# Patient Record
Sex: Male | Born: 2000
Health system: Southern US, Community
[De-identification: ages and names within clinical notes are randomized; demographics above are authoritative.]

## PROBLEM LIST (undated history)

## (undated) DIAGNOSIS — E049 Nontoxic goiter, unspecified: Secondary | ICD-10-CM

## (undated) DIAGNOSIS — R6252 Short stature (child): Secondary | ICD-10-CM

## (undated) DIAGNOSIS — E063 Autoimmune thyroiditis: Secondary | ICD-10-CM

## (undated) DIAGNOSIS — Q909 Down syndrome, unspecified: Secondary | ICD-10-CM

## (undated) DIAGNOSIS — F909 Attention-deficit hyperactivity disorder, unspecified type: Secondary | ICD-10-CM

## (undated) DIAGNOSIS — K5904 Chronic idiopathic constipation: Secondary | ICD-10-CM

## (undated) DIAGNOSIS — K561 Intussusception: Secondary | ICD-10-CM

## (undated) DIAGNOSIS — E301 Precocious puberty: Secondary | ICD-10-CM

## (undated) DIAGNOSIS — E079 Disorder of thyroid, unspecified: Secondary | ICD-10-CM

## (undated) HISTORY — DX: Nontoxic goiter, unspecified: E04.9

## (undated) HISTORY — DX: Short stature (child): R62.52

## (undated) HISTORY — DX: Precocious puberty: E30.1

## (undated) HISTORY — DX: Chronic idiopathic constipation: K59.04

## (undated) HISTORY — DX: Down syndrome, unspecified: Q90.9

## (undated) HISTORY — DX: Intussusception: K56.1

## (undated) HISTORY — DX: Attention-deficit hyperactivity disorder, unspecified type: F90.9

## (undated) HISTORY — DX: Disorder of thyroid, unspecified: E07.9

## (undated) HISTORY — DX: Autoimmune thyroiditis: E06.3

---
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
## 2018-02-19 ENCOUNTER — Telehealth: Payer: Self-pay | Admitting: Pediatrics

## 2018-02-19 NOTE — Telephone Encounter (Signed)
Can you call in adderol XR 20 mg to [facility] [address] has a med ck on Tuesday March 3rd, Mom is aware you will call this in Monday 2/17

## 2018-02-22 MED ORDER — AMPHETAMINE-DEXTROAMPHET ER 20 MG PO CP24: 20.0000 mg | ORAL_CAPSULE | Freq: Every day | ORAL | 0 refills | Status: DC

## 2018-02-22 NOTE — Telephone Encounter (Signed)
Medications refilled X 1 month

## 2018-03-09 ENCOUNTER — Encounter: Payer: Medicaid Other | Admitting: Pediatrics

## 2018-03-17 ENCOUNTER — Other Ambulatory Visit: Payer: Self-pay

## 2018-03-17 ENCOUNTER — Encounter: Payer: Self-pay | Admitting: Pediatrics

## 2018-03-17 ENCOUNTER — Ambulatory Visit: Payer: Medicaid Other | Admitting: Pediatrics

## 2018-03-17 VITALS — BP 120/68 | Ht 67.25 in | Wt 135.3 lb

## 2018-03-17 DIAGNOSIS — F902 Attention-deficit hyperactivity disorder, combined type: Secondary | ICD-10-CM

## 2018-03-17 MED ORDER — AMPHETAMINE-DEXTROAMPHET ER 20 MG PO CP24: 20.0000 mg | ORAL_CAPSULE | Freq: Every day | ORAL | 0 refills | Status: DC

## 2018-03-17 MED ORDER — CEPHALEXIN 500 MG PO CAPS: 500.0000 mg | ORAL_CAPSULE | Freq: Two times a day (BID) | ORAL | 0 refills | Status: AC

## 2018-03-17 NOTE — Progress Notes (Signed)
ADHD meds refilled after normal weight and Blood pressure. Doing well on present dose. See again in 3 months  

## 2018-03-18 ENCOUNTER — Encounter: Payer: Self-pay | Admitting: Pediatrics

## 2018-03-25 LAB — TSH: TSH: 1.42 mIU/L (ref 0.50–4.30)

## 2018-03-25 LAB — T4, FREE: Free T4: 1.3 ng/dL (ref 0.8–1.4)

## 2018-03-25 LAB — T3, FREE: T3, Free: 3.1 pg/mL (ref 3.0–4.7)

## 2018-04-02 ENCOUNTER — Ambulatory Visit (INDEPENDENT_AMBULATORY_CARE_PROVIDER_SITE_OTHER): Payer: Medicaid Other | Admitting: "Endocrinology

## 2018-04-02 ENCOUNTER — Other Ambulatory Visit: Payer: Self-pay

## 2018-04-02 DIAGNOSIS — E663 Overweight: Secondary | ICD-10-CM

## 2018-04-02 DIAGNOSIS — E063 Autoimmune thyroiditis: Secondary | ICD-10-CM

## 2018-04-02 DIAGNOSIS — Q909 Down syndrome, unspecified: Secondary | ICD-10-CM | POA: Diagnosis not present

## 2018-04-02 DIAGNOSIS — E049 Nontoxic goiter, unspecified: Secondary | ICD-10-CM

## 2018-04-02 NOTE — Patient Instructions (Signed)
Follow up visit in 4 months. Please obtain lab tests 1-2 weeks prior.  

## 2018-04-02 NOTE — Progress Notes (Signed)
  This is a Pediatric Specialist E-Visit follow up consult provided via  Telephone,  Tony Copeland and his mother, Tony Copeland consented to an E-Visit consult today.  Location of patient: [name] is at home (location) Location of provider Molli Knock MD is at the office. Patient was referred by Georgiann Hahn, MD   The following participants were involved in this E-Visit: Ms Shimomura and Dr. Fransico Michael  Chief Complain/ Reason for E-Visit today: Acquired hypothyroidism due to hashimoto's disease Total time on call: 34 minutes Follow up: 4 months

## 2018-04-02 NOTE — Progress Notes (Signed)
Subjective:  Patient Name: Tony Copeland Date of Birth: 01-22-2000  MRN: [id number]  Tony Copeland  presents for this televisit today for follow-up of his acquired primary hypothyroidism, thyroiditis, goiter, and linear growth delay in the setting of Down's syndrome.  HISTORY OF PRESENT ILLNESS:   Tony Antu. is a 18 y.o. Caucasian young man.  [name] was accompanied by his mother.  1. [name] was first referred to me on 10/23/2004 by his PCP, Dr. Caroll Rancher, of [facility], at age 53 for evaluation and management of hypothyroidism in the setting of Down's Syndrome.   A. He had had a several year history of having TSH values in the 4.0-5.0 range. Because the child was an active little boy and because these values were "within normal" according to the lab reference values, no actions were taken. In retrospect, the child tended to be cold frequently. He also had problems with constipation. His past medical history was positive for ADHD and for intussusception repair. Family history was positive for the mother having a goiter and the maternal grandmother taking thyroid medication. Labs on 10/18/04 showed a TSH of 7.328 and a free T4 1.19. He was started on Synthroid, 25 mcg per day at that time.   B. After two months on Synthroid therapy he was brighter, more active, and more interactive. We have continued to treat him with Synthroid ever since, with gradual increases in doses as indicated by serial TFT measurements. Our goal has been to maintain his TSH in the "ideal" range of 1.0-2.0, which is the normal TSH range for 2/3ds of the normal population.  2. The patient's last Pediatric Specialists visit was on 10/05/17. At that visit I continued his Synthroid dose of 100 mcg/day.   A. In the interim, he has been generally healthy. He has not had any further heart palpitations. He continues to have intermittent problems with his scalp flaking. His dermatologist recommended a special selsun shampoo.    B. He is still being treated for ADHD with Adderall XR.  C. He continues to be a big star in [employer]. He was selected to represent [place] at the [place] [employer] in [place] and won gold, silver and Environmental consultant. Based on his performance in [place], he was selected to participate as a member of Team [place] for the [employer] in [place] in March 2019. He was there for three weeks. He played soccer and basketball with S.O. until the corona virus closed things down.    3. Pertinent Review of Systems:  Constitutional: Tony Copeland feels "good". Mother says that he has been doing well overall. Eyes: Vision is good. He wears his glasses for reading. There are no significant eye complaints. He had an eye exam this morning. Neck: The patient has no complaints of anterior neck swelling, soreness, tenderness,  pressure, discomfort, or difficulty swallowing.  Heart: He had palpitations after exercise in October 2017. EKG and ECHO were normal. The palpitations subsequently resolved. Heart rate increases with exercise or other physical activity. The patient has no complaints of chest pain, or chest pressure.  Gastrointestinal: Appetite is good. He is sensitive to dairy products. He does not have any constipation as long as he takes docusate three times per week. He does not have any other complaints of excessive hunger, acid reflux, upset stomach, stomach aches or pains, or diarrhea. Legs: Muscle mass and strength seem normal. There are no complaints of numbness, tingling, burning, or pain. No edema is noted. Feet: There are  no obvious foot problems. There are no complaints of numbness, tingling, burning, or pain. No edema is noted. Neurological: His coordination and strength remain good.   PAST MEDICAL, FAMILY, AND SOCIAL HISTORY:  Past Medical History:  Diagnosis Date  . ADHD (attention deficit hyperactivity disorder)   . Constipation - functional 09/29/2011   Miralax prn  . Delayed linear growth    . Down syndrome   . Down's syndrome   . Goiter   . Hypothyroidism, acquired, autoimmune   . Intussusception of small intestine (HCC) 03/1999   meckel's leading point  . Isosexual precocity   . Thyroiditis, autoimmune     Family History  Problem Relation Age of Onset  . Thyroid disease Mother   . Thyroid disease Maternal Grandmother   . Hypertension Maternal Grandmother   . Cancer Maternal Grandfather        bone  . Heart disease Maternal Grandfather   . Cancer Paternal Grandmother        breast  . Diabetes Neg Hx      Current Outpatient Medications:  .  amphetamine-dextroamphetamine (ADDERALL XR) 20 MG 24 hr capsule, Take 1 capsule (20 mg total) by mouth daily with breakfast., Disp: 30 capsule, Rfl: 0 .  [START ON 04/17/2018] amphetamine-dextroamphetamine (ADDERALL XR) 20 MG 24 hr capsule, Take 1 capsule (20 mg total) by mouth daily with breakfast., Disp: 30 capsule, Rfl: 0 .  [START ON 05/17/2018] amphetamine-dextroamphetamine (ADDERALL XR) 20 MG 24 hr capsule, Take 1 capsule (20 mg total) by mouth daily with breakfast., Disp: 30 capsule, Rfl: 0 .  SYNTHROID 100 MCG tablet, TAKE ONE TABLET BY MOUTH ONCE DAILY, Disp: 90 tablet, Rfl: 1 .  ketoconazole (NIZORAL) 2 % shampoo, Apply 1 application topically 2 (two) times a week. (Patient not taking: Reported on 01/08/2016), Disp: 120 mL, Rfl: 3  Allergies as of 04/02/2018  . (No Known Allergies)   1. School: He is in the 11th grade at [employer]. School is currently closed due to the corona virus. It is unknown at this time whether or nor school will re-open during this academic year.  2. 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   3. Primary Care Provider: Georgiann Hahn, MD  4. He is now covered by Medicaid.   REVIEW OF SYSTEMS: There are no other significant problems involving [name]'s other body systems.   Objective:  LAB DATA:  Results for orders placed or performed in  visit on 10/05/17  T3, free  Result Value Ref Range   T3, Free 3.1 3.0 - 4.7 pg/mL  T4, free  Result Value Ref Range   Free T4 1.3 0.8 - 1.4 ng/dL  TSH  Result Value Ref Range   TSH 1.42 0.50 - 4.30 mIU/L   03/24/18: TSH 1.42, free T4 1.0, free T3 3.1  09/24/17: TSH 1.82, free T4 1.3, free T3 3.2  08/13/16: TSH 2.68, free T4 1.38, free T3 2.7  10/19/15: TSH 3.04, free T4 1.3, free T3 3.04; normal CBC  06/13/15: HbA1c 5.0%; TSH 2.34, free T4 1.3, free T3 3.2  11/24/14: HbA1c 5.0%; TSH 1.899, free T4 1.20, free T3 3.1; C-peptide 3.11 (ref 0.80-3.90)  05/24/14: TSH 2.338, free T4 1.05, free T3 3.3; C-peptide 2.92 (ref 0.80-3.90)  11/14/13: TSH 1.946, free T4 1.21, free T3 3.5   05/18/13: TSH 1.541, free T4 1.27, free T3 3.2  11/09/12: TSH 1.454, free T4 1.39, free T3 3.4  04/30/12: TSH 1.407, free T4  1.28, free T3 3.5  11/03/11: TSH 1.366, free T4 1.41, free T3 3.6   Assessment and Plan:   ASSESSMENT:  1. Hypothyroid, secondary to Hashimoto's disease: His thyroid function tests in March 2020 are within normal limits with his TSH within the ideal treatment goal range of 1.0-2.0. His current Synthroid dose is working for him now.   2. Goiter: His thyroid gland is apparently unchanged in size.  3. Thyroiditis: He has not been complaining of any symptoms. Mom has not seen him favoring his anterior neck. 4. Linear growth delay: Tony Copeland has essentially plateaued in height, c/w his age.   5. Down's syndrome: Tony Copeland is blessed to have a good family that provides him emotional support and ensures that he is as active athletically and socially as he can be.  He misses participating in S.O. activities and being with his friends in school. He is going stir-crazy at home.  6. Overweight: Tony Copeland's weight was higher at his last visit.  He was much more muscular and has much less body fat than the average teenager with Down's syndrome.   PLAN:  1. Diagnostic: We reviewed his lab results from last week.  Repeat TFTs in 4 months.   2. Therapeutic: Continue the Synthroid dose of 100 mcg/day.   3. Patient education: We discussed the likelihood that his Hashimoto's thyroiditis will eventually destroy more and more thyrocytes, resulting in higher and higher doses of Synthroid over time. Also discussed the importance of healthy diet and continuing his excellent level of physical activity.  4. Follow-up: 4 months  Level of Service: This visit lasted 34 minutes.   Molli KnockMichael Shalae Belmonte, MD, CDE Pediatric and Adult Endocrinology

## 2018-04-05 ENCOUNTER — Ambulatory Visit (INDEPENDENT_AMBULATORY_CARE_PROVIDER_SITE_OTHER): Payer: Medicaid Other | Admitting: Pediatrics

## 2018-04-05 ENCOUNTER — Encounter: Payer: Self-pay | Admitting: Pediatrics

## 2018-04-05 ENCOUNTER — Other Ambulatory Visit: Payer: Self-pay

## 2018-04-05 VITALS — Ht 62.5 in | Wt 136.0 lb

## 2018-04-05 DIAGNOSIS — L0100 Impetigo, unspecified: Secondary | ICD-10-CM | POA: Diagnosis not present

## 2018-04-05 MED ORDER — CLINDAMYCIN HCL 300 MG PO CAPS: 300.0000 mg | ORAL_CAPSULE | Freq: Two times a day (BID) | ORAL | 0 refills | Status: AC

## 2018-04-05 NOTE — Patient Instructions (Signed)
Impetigo, Adult  Impetigo is an infection of the skin. It commonly occurs in young children, but it can also occur in adults. The infection causes itchy blisters and sores that produce brownish-yellow fluid. As the fluid dries, it forms a thick, honey-colored crust. These skin changes usually occur on the face, but they can also affect other areas of the body. Impetigo usually goes away in 7-10 days with treatment.  What are the causes?  This condition is caused by two types of bacteria. It may be caused by staphylococci or streptococci bacteria. These bacteria cause impetigo when they get under the surface of the skin. This often happens after some damage to the skin, such as:  Cuts, scrapes, or scratches.  Rashes.  Insect bites, especially when you scratch the area of a bite.  Chickenpox or other illnesses that cause open skin sores.  Nail biting or chewing.  Impetigo can spread easily from one person to another (is contagious). It may be spread through close skin contact or by sharing towels, clothing, or other items that an infected person has touched.  What increases the risk?  The following factors may make you more likely to develop this condition:  Playing sports that include skin-to-skin contact with others.  Having a skin condition with open sores, such as chickenpox.  Having diabetes.  Having a weak body defense system (immune system).  Having many skin cuts or scrapes.  Living in an area that has high humidity levels.  Having poor hygiene.  Having high levels of staphylococci in your nose.  What are the signs or symptoms?  The main symptom of this condition is small blisters, often on the face around the mouth and nose. In time, the blisters break open and turn into tiny sores (lesions) with a yellow crust. In some cases, the blisters cause itching or burning. With scratching, irritation, or lack of treatment, these small lesions may get larger.  Other possible symptoms include:  Larger  blisters.  Pus.  Swollen lymph glands.  Scratching the affected area can cause impetigo to spread to other parts of the body. The bacteria can get under the fingernails and spread when you touch another area of your skin.  How is this diagnosed?  This condition is usually diagnosed during a physical exam. A skin sample or a sample of fluid from a blister may be taken for lab tests that involve growing bacteria (culture test). Lab tests can help to confirm the diagnosis or help to determine the best treatment.  How is this treated?  Treatment for this condition depends on the severity of the condition:  Mild impetigo can be treated with prescription antibiotic cream.  Oral antibiotic medicine may be used in more severe cases.  Medicines that reduce itchiness (antihistamines)may also be used.  Follow these instructions at home:  Medicines  Take over-the-counter and prescription medicines only as told by your health care provider.  Apply or take your antibiotic as told by your health care provider. Do not stop using the antibiotic even if your condition improves.  General instructions    To help prevent impetigo from spreading to other body areas:  Keep your fingernails short and clean.  Do not scratch the blisters or sores.  Cover infected areas, if necessary, to keep from scratching.  Wash your hands often with soap and warm water.  Before applying antibiotic cream or ointment, you should:  Gently wash the infected areas with antibacterial soap and warm water.  Soak crusted   areas in warm, soapy water using antibacterial soap.  Gently rub the areas to remove crusts. Do not scrub.  Do not share towels.  Wash your clothing and bedsheets in warm water that is 140F (60C) or warmer.  Stay home until you have used an antibiotic cream for 48 hours (2 days) or an oral antibiotic medicine for 24 hours (1 day). You should only return to work and activities with other people if your skin shows significant improvement.  You may  return to contact sports after you have used antibiotic medicine for 72 hours (3 days).  Keep all follow-up visits as told by your health care provider. This is important.  How is this prevented?  Wash your hands often with soap and warm water.  Do not share towels, washcloths, clothing, bedding, or razors.  Keep your fingernails short.  Keep any cuts, scrapes, bug bites, or rashes clean and covered.  Use insect repellent to prevent bug bites.  Contact a health care provider if:  You develop more blisters or sores even with treatment.  Other family members get sores.  Your skin sores are not improving after 72 hours (3 days) of treatment.  You have a fever.  Get help right away if:  You see spreading redness or swelling of the skin around your sores.  You see red streaks coming from your sores.  You develop a sore throat.  The area around your rash becomes warm, red, or tender to the touch.  You have dark, reddish-brown urine.  You do not urinate often or you urinate small amounts.  You are very tired (lethargic).  You have swelling in the face, hands, or feet.  Summary  Impetigo is a skin infection that causes itchy blisters and sores that produce brownish-yellow fluid. As the fluid dries, it forms a crust.  This condition is caused by staphylococci or streptococci bacteria. These bacteria cause impetigo when they get under the surface of the skin, such as through cuts, rashes, bug bites, or open sores.  Treatment for this condition may include antibiotic ointment or oral antibiotics.  To help prevent impetigo from spreading to other body areas, make sure you keep your fingernails short, avoid scratching, cover any blisters, and wash your hands often.  If you have impetigo, stay home until you have used an antibiotic cream for 48 hours (2 days) or an oral antibiotic medicine for 24 hours (1 day). You should only return to work and activities with other people if your skin shows significant improvement.  This  information is not intended to replace advice given to you by your health care provider. Make sure you discuss any questions you have with your health care provider.  Document Released: 01/13/2014 Document Revised: 01/15/2016 Document Reviewed: 01/15/2016  Elsevier Interactive Patient Education  2019 Elsevier Inc.

## 2018-04-05 NOTE — Progress Notes (Signed)
Virtual Visit via Telephone Note  I connected with Tony Copeland mom on 04/05/18 at 11:45 AM EDT by telephone and verified that I am speaking with the correct person using two identifiers.   I discussed the limitations, risks, security and privacy concerns of performing an evaluation and management service by telephone and the availability of in person appointments. I also discussed with the patient that there may be a patient responsible charge related to this service. The patient expressed understanding and agreed to proceed.   Presents from picture sent  with red papules to anterior abdominal wall  for the past three days. Low grade fever, no discharge, no swelling and no limitation of motion.   Review of Systems  Constitutional: Negative.  Negative for fever, activity change and appetite change.  HENT: Negative.  Negative for ear pain, congestion and rhinorrhea.   Eyes: Negative.   Respiratory: Negative.  Negative for cough and wheezing.   Cardiovascular: Negative.   Gastrointestinal: Negative.   Musculoskeletal: Negative.  Negative for myalgias, joint swelling and gait problem.  Neurological: Negative for numbness.  Hematological: Negative for adenopathy. Does not bruise/bleed easily.        Objective:   Physical Exam  Constitutional: Appears well-developed and well-nourished. Active. No distress.  Mom described redness and swollen area to armpit bilaterally---may need drainage.       Assessment:     Impetigo secondary to folliculitis    Plan:   Will treat with oral clindamycin and follow up in 2-3 days --evaluate for I and D at that visit.      I discussed the assessment and treatment plan with the patient. The patient was provided an opportunity to ask questions and all were answered. The patient agreed with the plan and demonstrated an understanding of the instructions.   The patient was advised to call back or seek an in-person evaluation if the symptoms worsen or  if the condition fails to improve as anticipated.  I provided 15 minutes of non-face-to-face time during this encounter.   Georgiann Hahn, MD

## 2018-04-08 ENCOUNTER — Ambulatory Visit: Payer: Medicaid Other | Admitting: Pediatrics

## 2018-05-17 ENCOUNTER — Encounter: Payer: Self-pay | Admitting: Pediatrics

## 2018-05-17 ENCOUNTER — Ambulatory Visit (INDEPENDENT_AMBULATORY_CARE_PROVIDER_SITE_OTHER): Payer: Medicaid Other | Admitting: Pediatrics

## 2018-05-17 ENCOUNTER — Other Ambulatory Visit: Payer: Self-pay

## 2018-05-17 VITALS — BP 100/74 | Ht 62.75 in | Wt 132.0 lb

## 2018-05-17 DIAGNOSIS — Z0001 Encounter for general adult medical examination with abnormal findings: Secondary | ICD-10-CM

## 2018-05-17 DIAGNOSIS — Z00129 Encounter for routine child health examination without abnormal findings: Secondary | ICD-10-CM

## 2018-05-17 DIAGNOSIS — Q909 Down syndrome, unspecified: Secondary | ICD-10-CM

## 2018-05-17 DIAGNOSIS — F909 Attention-deficit hyperactivity disorder, unspecified type: Secondary | ICD-10-CM

## 2018-05-17 DIAGNOSIS — Z68.41 Body mass index (BMI) pediatric, 5th percentile to less than 85th percentile for age: Secondary | ICD-10-CM | POA: Diagnosis not present

## 2018-05-17 NOTE — Patient Instructions (Signed)
Preventive Care for [place], Male The transition to life after high school as a young adult can be a stressful time with many changes. You may start seeing a primary care physician instead of a pediatrician. This is the time when your health care becomes your responsibility. Preventive care refers to lifestyle choices and visits with your health care provider that can promote health and wellness. What does preventive care include?  A yearly physical exam. This is also called an annual wellness visit.  Dental exams once or twice a year.  Routine eye exams. Ask your health care provider how often you should have your eyes checked.  Personal lifestyle choices, including: ? Daily care of your teeth and gums. ? Regular physical activity. ? Eating a healthy diet. ? Avoiding tobacco and drug use. ? Avoiding or limiting alcohol use. ? Practicing safe sex. What happens during an annual wellness visit? Preventive care starts with a yearly visit to your primary care physician. The services and screenings done by your health care provider during your annual wellness visit will depend on your overall health, lifestyle risk factors, and family history of disease. Counseling Your health care provider may ask you questions about:  Past medical problems and your family's medical history.  Medicines or supplements that you take.  Health insurance and access to health care.  Alcohol, tobacco, and drug use, including use of any bodybuilding drugs (anabolic steroids).  Your safety at home, work, or school.  Access to firearms.  Emotional well-being and how you cope with stress.  Relationship well-being.  Diet, exercise, and sleep habits.  Your sexual health and activity. Screening You may have the following tests or measurements:  Height, weight, and BMI.  Blood pressure.  Lipid and cholesterol levels.  Tuberculosis skin test.  Skin exam.  Vision and hearing tests.  Genital  exam to check for testicular cancer or hernias.  Screening test for hepatitis.  Screening tests for STDs (sexually transmitted diseases), if you are at risk. Vaccines Your health care provider may recommend certain vaccines, such as:  Influenza vaccine. This is recommended every year.  Tetanus, diphtheria, and acellular pertussis (Tdap, Td) vaccine. You may need a Td booster every 10 years.  Varicella vaccine. You may need this if you have not been vaccinated.  HPV vaccine. If you are 100 or younger, you may need three doses over 6 months.  Measles, mumps, and rubella (MMR) vaccine. You may need at least one dose of MMR. You may also need a second dose.  Pneumococcal 13-valent conjugate (PCV13) vaccine. You may need this if you have certain conditions and have not been vaccinated.  Pneumococcal polysaccharide (PPSV23) vaccine. You may need one or two doses if you smoke cigarettes or if you have certain conditions.  Meningococcal vaccine. One dose is recommended if you are age 48-21 years and a first-year college student living in a residence hall, or if you have one of several medical conditions. You may also need additional booster doses.  Hepatitis A vaccine. You may need this if you have certain conditions or if you travel or work in places where you may be exposed to hepatitis A.  Hepatitis B vaccine. You may need this if you have certain conditions or if you travel or work in places where you may be exposed to hepatitis B.  Haemophilus influenzae type b (Hib) vaccine. You may need this if you have certain risk factors. Talk to your health care provider about which screenings and vaccines  you need and how often you need them. What steps can I take to develop healthy behaviors?      Have regular preventive health care visits with your primary care physician and dentist.  Eat a healthy diet.  Drink enough fluid to keep your urine clear or pale yellow.  Stay active. Exercise  at least 30 minutes 5 or more days of the week.  Use alcohol responsibly.  Maintain a healthy weight.  Do not use any products that contain nicotine, such as cigarettes, chewing tobacco, and e-cigarettes. If you need help quitting, ask your health care provider.  Do not use drugs.  Practice safe sex. This includes using condoms to prevent STDs or an unwanted pregnancy.  Find healthy ways to manage stress. How can I protect myself from injury? Injuries from violence or accidents are the leading cause of death among young adults and can often be prevented. Take these steps to help protect yourself:  Always wear your seat belt while driving or riding in a vehicle.  Do not drive if you have been drinking alcohol. Do not ride with someone who has been drinking.  Do not drive when you are tired or distracted. Do not text while driving.  Wear a helmet and other protective equipment during sports activities.  If you have firearms in your house, make sure you follow all gun safety procedures.  Seek help if you have been bullied, physically abused, or sexually abused.  Avoid fighting.  Use the Internet responsibly to avoid dangers such as online bullying. What can I do to cope with stress? Young adults may face many new challenges that can be stressful, such as finding a job, going to college, moving away from home, managing money, being in a relationship, getting married, and having children. To manage stress:  Avoid known stressful situations when you can.  Exercise regularly.  Find a stress-reducing activity that works best for you. Examples include meditation, yoga, listening to music, or reading.  Spend time in nature.  Keep a journal to write about your stress and how you respond.  Talk to your health care provider about stress. He or she may suggest counseling.  Spend time with supportive friends or family.  Do not cope with stress by: ? Drinking alcohol or using drugs.  ? Smoking cigarettes. ? Eating. Where can I get more information? Learn more about preventive care and healthy habits from:  U.S. Preventive Services Task Force: StageSync.si  National Adolescent and [place]: StrategicRoad.nl  American Academy of Pediatrics Bright Futures: https://brightfutures.MemberVerification.co.za  Society for Adolescent Health and Medicine: MoralBlog.co.za.aspx  PodExchange.nl: ToyLending.fr This information is not intended to replace advice given to you by your health care provider. Make sure you discuss any questions you have with your health care provider. Document Released: 05/10/2015 Document Revised: 08/05/2016 Document Reviewed: 05/10/2015 Elsevier Interactive Patient Education  2019 [employer].

## 2018-05-17 NOTE — Progress Notes (Signed)
Adolescent Well Care Visit Tony Copeland is a 18 y.o. male who is here for well care.    PCP:  Georgiann Hahn, MD   History was provided by the patient and mother.  Current Issues: Current concerns include: trisomy 21/Thyroid disease/ ADHD   Nutrition: Nutrition/Eating Behaviors: good Adequate calcium in diet?: yes Supplements/ Vitamins: yes  Exercise/ Media: Play any Sports?/ Exercise: yes Screen Time:  < 2 hours Media Rules or Monitoring?: yes  Sleep:  Sleep: 8-10 hours  Social Screening: Lives with:  parents Parental relations:  good Activities, Work, and Regulatory affairs officer?: yes Concerns regarding behavior with peers?  no Stressors of note: no  Education:  School Grade: 12 School performance: doing well; no concerns School Behavior: doing well; no concerns  Menstruation:   No LMP for male patient.   Tobacco?  no Secondhand smoke exposure?  no Drugs/ETOH?  no  Sexually Active?  no     Safe at home, in school & in relationships?  Yes Safe to self?  Yes   Screenings: Patient has a dental home: yes  The patient completed the Rapid Assessment for Adolescent Preventive Services screening questionnaire and the following topics were identified as risk factors and discussed: healthy eating, exercise, seatbelt use, bullying, abuse/trauma, weapon use, tobacco use, marijuana use, drug use, condom use, birth control, sexuality, suicidality/self harm, mental health issues, social isolation, school problems, family problems and screen time    PHQ-9 completed and results indicated --no risk  General Appearance:   alert, oriented, no acute distress and well nourished  HENT: Normocephalic, no obvious abnormality, conjunctiva clear  Mouth:   Normal appearing teeth, no obvious discoloration, dental caries, or dental caps  Neck:   Supple; thyroid: no enlargement, symmetric, no tenderness/mass/nodules  Chest normal  Lungs:   Clear to auscultation bilaterally, normal work of  breathing  Heart:   Regular rate and rhythm, S1 and S2 normal, no murmurs;   Abdomen:   Soft, non-tender, no mass, or organomegaly  GU normal male genitals, no testicular masses or hernia  Musculoskeletal:   Tone and strength strong and symmetrical, all extremities               Lymphatic:   No cervical adenopathy  Skin/Hair/Nails:   Skin warm, dry and intact, no rashes, no bruises or petechiae  Neurologic:   Strength, gait, and coordination normal and age-appropriate     Assessment and Plan:   Annual Physical    BMI is appropriate for age  Hearing screening result:normal Vision screening result: normal  Counseling provided for all of the vaccine components  MEN B --mom  counseled on men B--uses and benefits and written literature given--mom to discuss with dad before giving it.   Return in about 1 year (around 05/17/2019).Georgiann Hahn, MD

## 2018-07-14 ENCOUNTER — Other Ambulatory Visit: Payer: Self-pay

## 2018-07-14 ENCOUNTER — Ambulatory Visit (INDEPENDENT_AMBULATORY_CARE_PROVIDER_SITE_OTHER): Payer: Medicaid Other | Admitting: "Endocrinology

## 2018-07-14 ENCOUNTER — Encounter (INDEPENDENT_AMBULATORY_CARE_PROVIDER_SITE_OTHER): Payer: Self-pay | Admitting: "Endocrinology

## 2018-07-14 VITALS — BP 98/60 | HR 88 | Ht 62.91 in | Wt 137.6 lb

## 2018-07-14 DIAGNOSIS — Q909 Down syndrome, unspecified: Secondary | ICD-10-CM | POA: Diagnosis not present

## 2018-07-14 DIAGNOSIS — E049 Nontoxic goiter, unspecified: Secondary | ICD-10-CM

## 2018-07-14 DIAGNOSIS — E063 Autoimmune thyroiditis: Secondary | ICD-10-CM

## 2018-07-14 DIAGNOSIS — E663 Overweight: Secondary | ICD-10-CM

## 2018-07-14 NOTE — Progress Notes (Signed)
Subjective:  Patient Name: Tony Copeland Date of Birth: 08/28/2000  MRN: [id number]  Tony Copeland  presents at his clinic visit today for follow-up of his acquired primary hypothyroidism, thyroiditis, and goiter, in the setting of Down's syndrome.  HISTORY OF PRESENT ILLNESS:   Tony Dyke. is a 18 y.o. Caucasian young man.  [name] was accompanied by his mother.  1. [name] was first referred to me on 10/23/2004 by his PCP, Dr. Evonnie Pat, of [facility], at age 57 for evaluation and management of hypothyroidism in the setting of Down's Syndrome.   A. He had had a several year history of having TSH values in the 4.0-5.0 range. Because the child was an active little boy and because these values were "within normal" according to the lab reference values, no actions were taken. In retrospect, the child tended to be cold frequently. He also had problems with constipation. His past medical history was positive for ADHD and for intussusception repair. Family history was positive for the mother having a goiter and the maternal grandmother taking thyroid medication. Labs on 10/18/04 showed a TSH of 7.328 and a free T4 1.19. He was started on Synthroid, 25 mcg per day at that time.   B. After two months on Synthroid therapy he was brighter, more active, and more interactive. We have continued to treat him with Synthroid ever since, with gradual increases in doses as indicated by serial TFT measurements. Our goal has been to maintain his TSH in the "ideal" range of 1.0-2.0, which is the normal TSH range for 2/3ds of the normal population.  2. [facility] was on 04/02/18. At that visit I continued his Synthroid dose of 100 mcg/day.   A. In the interim, he has been healthy. He has not had any further heart palpitations. He continues to have intermittent problems with his scalp flaking. His dermatologist recommended a special selsun shampoo that he uses  occasionally.   B. He is still being treated for ADHD with Adderall XR.  C. 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   3. Pertinent Review of Systems:  Constitutional: [name] feels "good". Mother says that he has been doing well overall. He remains very bright and sharp mentally, but has been frustrated by the covid-19 restrictions on his physical activities. . Eyes: Vision is good. He wears his glasses for reading. There are no significant eye complaints. His last eye exam occurred on 04/02/18.  Neck: [name] has no complaints of anterior neck swelling, soreness, tenderness,  pressure, discomfort, or difficulty swallowing.  Heart: He had an episode of palpitations after exercise in October 2017. EKG and ECHO were normal. The palpitations subsequently resolved and have not recurred. Marland Kitchen Heart rate increases with exercise or other physical activity. Tony Copeland has no complaints of chest pain, or chest pressure.  Gastrointestinal: Appetite is good. He is sensitive to dairy products. He does not have any constipation as long as he takes docusate three times per week. He does not have any other complaints of excessive hunger, acid reflux, upset stomach, stomach aches or pains, or diarrhea. Legs: Muscle mass and strength seem normal. There are no complaints of numbness, tingling, burning, or pain. No edema is noted. Feet: There are no obvious foot problems. There are no complaints of numbness, tingling, burning, or pain. No edema is noted. Neurological: His coordination and strength remain good.  PAST MEDICAL, FAMILY, AND SOCIAL HISTORY:  Past Medical History:  Diagnosis Date  . ADHD (attention deficit hyperactivity disorder)   . Constipation - functional 09/29/2011   Miralax prn  . Delayed linear growth   . Down syndrome   . Down's syndrome   . Goiter   . Hypothyroidism, acquired, autoimmune   . Intussusception of small intestine (HCC)  03/1999   meckel's leading point  . Isosexual precocity   . Thyroiditis, autoimmune     Family History  Problem Relation Age of Onset  . Thyroid disease Mother   . Thyroid disease Maternal Grandmother   . Hypertension Maternal Grandmother   . Cancer Maternal Grandfather        bone  . Heart disease Maternal Grandfather   . Cancer Paternal Grandmother        breast  . Diabetes Neg Hx      Current Outpatient Medications:  .  amphetamine-dextroamphetamine (ADDERALL XR) 20 MG 24 hr capsule, Take 1 capsule (20 mg total) by mouth daily with breakfast., Disp: 30 capsule, Rfl: 0 .  ketoconazole (NIZORAL) 2 % shampoo, Apply 1 application topically 2 (two) times a week., Disp: 120 mL, Rfl: 3 .  SYNTHROID 100 MCG tablet, TAKE ONE TABLET BY MOUTH ONCE DAILY, Disp: 90 tablet, Rfl: 1 .  amphetamine-dextroamphetamine (ADDERALL XR) 20 MG 24 hr capsule, Take 1 capsule (20 mg total) by mouth daily with breakfast., Disp: 30 capsule, Rfl: 0 .  amphetamine-dextroamphetamine (ADDERALL XR) 20 MG 24 hr capsule, Take 1 capsule (20 mg total) by mouth daily with breakfast., Disp: 30 capsule, Rfl: 0  Allergies as of 07/14/2018  . (No Known Allergies)   1. School: He will start the 12th grade at [employer].  2. 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  He also now works at a coffee shop two days a week.  3. Primary Care Provider: Georgiann Hahnamgoolam, Andres, MD  4. He is now covered by Medicaid.   REVIEW OF SYSTEMS: There are no other significant problems involving [name]'s other body systems.   Objective:   BP 98/60   Pulse 88   Ht 5' 2.91" (1.598 m)   Wt 137 lb 9.6 oz (62.4 kg)   BMI 24.44 kg/m   Wt Readings from Last 3 Encounters:  07/14/18 137 lb 9.6 oz (62.4 kg) (21 %, Z= -0.80)*  05/17/18 132 lb (59.9 kg) (14 %, Z= -1.08)*  04/05/18 136 lb (61.7 kg) (20 %, Z= -0.86)*   * Growth percentiles are based on CDC (Boys, 2-20 Years) data.    Ht Readings  from Last 3 Encounters:  07/14/18 5' 2.91" (1.598 m) (<1 %, Z= -2.37)*  05/17/18 5' 2.75" (1.594 m) (<1 %, Z= -2.42)*  04/05/18 5' 2.5" (1.588 m) (<1 %, Z= -2.50)*   * Growth percentiles are based on CDC (Boys, 2-20 Years) data.   Body mass index is 24.44 kg/m. 67 %ile (Z= 0.43) based on CDC (Boys, 2-20 Years) BMI-for-age based on BMI available as of 07/14/2018.  Body surface area is 1.66 meters squared.  Constitutional: [name] looks healthy and appears physically and emotionally well. He is alert and right. He engaged will with his mother and with me. He is very bright and sharp for a young man with Down's syndrome. His affect is very good. His insight is good.  Face: He has a typical Downs facies, with some asymmetrical rotation of the face and jaw.  Eyes: The eyes are  typical for Down's syndrome. There is no arcus or proptosis.  Mouth: The oropharynx appears normal. The tongue appears normal. There is no obvious gingivitis. He has a narrow jaw with some dental crowding. There is normal oral moisture.  Neck: There are no bruits present. The thyroid gland appears normal in size. The thyroid gland is enlarged at about 21 grams in size. The consistency of the thyroid gland is normal. There is no thyroid tenderness to palpation. Lungs: The lungs are clear. Air movement is good. Heart: The heart rhythm and rate appear normal. Heart sounds S1 and S2 are normal. I do not appreciate any pathologic heart murmurs. Abdomen: The abdominal size is normal. Bowel sounds are normal. The abdomen is soft and non-tender. There is no obviously palpable hepatomegaly, splenomegaly, or other masses.  Arms: Muscle mass appears appropriate for age. Radial pulses appear normal. Hands: There is no obvious tremor. Phalangeal and metacarpophalangeal joints appear normal. Palms are normal. Legs: Muscle mass appears appropriate for age. There is no edema.  Neurologic: Muscle strength is normal 5+ in both the upper and the  lower extremities. Muscle tone appears normal. Sensation to touch is normal in the legs.  LAB DATA:  Results for orders placed or performed in visit on 10/05/17  T3, free  Result Value Ref Range   T3, Free 3.1 3.0 - 4.7 pg/mL  T4, free  Result Value Ref Range   Free T4 1.3 0.8 - 1.4 ng/dL  TSH  Result Value Ref Range   TSH 1.42 0.50 - 4.30 mIU/L   03/24/18: TSH 1.42, free T4 1.0, free T3 3.1  09/24/17: TSH 1.82, free T4 1.3, free T3 3.2  08/13/16: TSH 2.68, free T4 1.38, free T3 2.7  10/19/15: TSH 3.04, free T4 1.3, free T3 3.04; normal CBC  06/13/15: HbA1c 5.0%; TSH 2.34, free T4 1.3, free T3 3.2  11/24/14: HbA1c 5.0%; TSH 1.899, free T4 1.20, free T3 3.1; C-peptide 3.11 (ref 0.80-3.90)  05/24/14: TSH 2.338, free T4 1.05, free T3 3.3; C-peptide 2.92 (ref 0.80-3.90)  11/14/13: TSH 1.946, free T4 1.21, free T3 3.5   05/18/13: TSH 1.541, free T4 1.27, free T3 3.2  11/09/12: TSH 1.454, free T4 1.39, free T3 3.4  04/30/12: TSH 1.407, free T4 1.28, free T3 3.5  11/03/11: TSH 1.366, free T4 1.41, free T3 3.6   Assessment and Plan:   ASSESSMENT:  1. Hypothyroid, secondary to Hashimoto's disease:   A. His thyroid function tests in March 2020 were mid-euthyroid and within the ideal treatment goal range of 1.0-2.0. His current Synthroid dose was working for him then.    B. He is clinically euthyroid today. We will repeat his TFTs today.  2. Goiter: His thyroid gland is unchanged in size.  3. Thyroiditis: He has not been complaining of any symptoms. Mom has not seen him favoring his anterior neck. 4. Linear growth delay: [name] has plateaued in height, c/w his age.   5. Down's syndrome: [name] is blessed to have a good family that provides him emotional support and ensures that he is as active athletically and socially as he can be.  He misses participating in S.O. activities and being with his friends in school. Family works hard to keep him busy.   6. Overweight: [name] has gained 1 pound  in the past 10 months. He is much more muscular and has much less body fat than the average teenager with Down's syndrome.   PLAN:  1. Diagnostic: We reviewed his lab  results from March. Repeat TFTs now and in 6 months.   2. Therapeutic: Continue the Synthroid dose of 100 mcg/day.   3. Patient education: We discussed the likelihood that his Hashimoto's thyroiditis will eventually destroy more and more thyrocytes, resulting in larger doses of Synthroid over time. Also discussed the importance of healthy diet and continuing his excellent level of physical activity.  4. Follow-up: 6 months  Level of Service: This visit lasted 45 minutes.   Molli KnockMichael Brennan, MD, CDE Pediatric and Adult Endocrinology

## 2018-07-14 NOTE — Patient Instructions (Signed)
Follow up visit in 6 months. Please repeat lab tests about one week prior.  

## 2018-07-15 LAB — TSH: TSH: 1.07 mIU/L (ref 0.50–4.30)

## 2018-07-15 LAB — T3, FREE: T3, Free: 3.1 pg/mL (ref 3.0–4.7)

## 2018-07-15 LAB — T4, FREE: FREE T4: 1.3 ng/dL (ref 0.8–1.4)

## 2018-07-19 ENCOUNTER — Encounter (INDEPENDENT_AMBULATORY_CARE_PROVIDER_SITE_OTHER): Payer: Self-pay | Admitting: *Deleted

## 2018-07-24 ENCOUNTER — Other Ambulatory Visit (INDEPENDENT_AMBULATORY_CARE_PROVIDER_SITE_OTHER): Payer: Self-pay | Admitting: "Endocrinology

## 2018-07-24 DIAGNOSIS — E063 Autoimmune thyroiditis: Secondary | ICD-10-CM

## 2018-07-29 ENCOUNTER — Telehealth (INDEPENDENT_AMBULATORY_CARE_PROVIDER_SITE_OTHER): Payer: Self-pay | Admitting: *Deleted

## 2018-07-29 MED ORDER — LEVOTHYROXINE SODIUM 100 MCG PO TABS: 100.0000 ug | ORAL_TABLET | Freq: Every day | ORAL | 5 refills | Status: DC

## 2018-07-29 NOTE — Telephone Encounter (Signed)
Spoke to mother, advised that Medicaid will not pay for name Synthroid. Per Dr. Tobe Sos we are sending in a script for the generic. Mother voiced understanding. Script sent.

## 2018-08-13 ENCOUNTER — Telehealth: Payer: Self-pay | Admitting: Pediatrics

## 2018-08-13 NOTE — Telephone Encounter (Signed)
Can you please call in adderol XR 20 mg to [facility]  he has a med ck on the 12th

## 2018-08-17 MED ORDER — AMPHETAMINE-DEXTROAMPHET ER 20 MG PO CP24: 20.0000 mg | ORAL_CAPSULE | Freq: Every day | ORAL | 0 refills | Status: DC

## 2018-08-17 NOTE — Telephone Encounter (Signed)
Called in refill  

## 2018-08-17 NOTE — Telephone Encounter (Signed)
Forwarded to Dr. Laurice Record, patients PCP

## 2018-08-18 ENCOUNTER — Ambulatory Visit (INDEPENDENT_AMBULATORY_CARE_PROVIDER_SITE_OTHER): Payer: Self-pay | Admitting: Pediatrics

## 2018-08-18 ENCOUNTER — Other Ambulatory Visit: Payer: Self-pay

## 2018-08-18 VITALS — BP 102/70 | Ht 62.75 in | Wt 139.6 lb

## 2018-08-18 DIAGNOSIS — F902 Attention-deficit hyperactivity disorder, combined type: Secondary | ICD-10-CM

## 2018-08-19 ENCOUNTER — Encounter: Payer: Self-pay | Admitting: Pediatrics

## 2018-08-19 MED ORDER — AMPHETAMINE-DEXTROAMPHET ER 20 MG PO CP24: 20.0000 mg | ORAL_CAPSULE | Freq: Every day | ORAL | 0 refills | Status: DC

## 2018-08-19 NOTE — Patient Instructions (Signed)

## 2018-08-19 NOTE — Progress Notes (Signed)
ADHD meds refilled after normal weight and Blood pressure. Doing well on present dose. See again in 3 months  

## 2018-10-01 ENCOUNTER — Ambulatory Visit (INDEPENDENT_AMBULATORY_CARE_PROVIDER_SITE_OTHER): Payer: Medicaid Other | Admitting: Pediatrics

## 2018-10-01 ENCOUNTER — Other Ambulatory Visit: Payer: Self-pay

## 2018-10-01 DIAGNOSIS — Z23 Encounter for immunization: Secondary | ICD-10-CM

## 2018-10-04 NOTE — Progress Notes (Signed)

## 2018-11-05 ENCOUNTER — Telehealth: Payer: Self-pay | Admitting: Pediatrics

## 2018-11-05 MED ORDER — AMPHETAMINE-DEXTROAMPHET ER 20 MG PO CP24: 20.0000 mg | ORAL_CAPSULE | Freq: Every day | ORAL | 0 refills | Status: DC

## 2018-11-05 NOTE — Telephone Encounter (Signed)
Mom called and made Tony Copeland's med mgmt appointment and needs a month supply sent to [facility] [address] of his Adderall 20mg  XR to last until his Dec 7th appointment

## 2018-11-22 ENCOUNTER — Other Ambulatory Visit: Payer: Self-pay

## 2018-11-22 DIAGNOSIS — Z20822 Contact with and (suspected) exposure to covid-19: Secondary | ICD-10-CM

## 2018-11-23 LAB — NOVEL CORONAVIRUS, NAA: SARS-CoV-2, NAA: NOT DETECTED

## 2018-12-15 ENCOUNTER — Other Ambulatory Visit: Payer: Self-pay

## 2018-12-15 ENCOUNTER — Ambulatory Visit (INDEPENDENT_AMBULATORY_CARE_PROVIDER_SITE_OTHER): Payer: Self-pay | Admitting: Pediatrics

## 2018-12-15 VITALS — BP 118/76 | Ht 62.5 in | Wt 138.9 lb

## 2018-12-15 DIAGNOSIS — F902 Attention-deficit hyperactivity disorder, combined type: Secondary | ICD-10-CM

## 2018-12-15 MED ORDER — AMPHETAMINE-DEXTROAMPHET ER 20 MG PO CP24: 20.0000 mg | ORAL_CAPSULE | Freq: Every day | ORAL | 0 refills | Status: DC

## 2018-12-16 ENCOUNTER — Encounter: Payer: Self-pay | Admitting: Pediatrics

## 2018-12-16 NOTE — Progress Notes (Signed)
ADHD meds refilled after normal weight and Blood pressure. Doing well on present dose. See again in 3 months  

## 2019-01-06 LAB — TSH: TSH: 0.86 mIU/L (ref 0.40–4.50)

## 2019-01-06 LAB — T3, FREE: T3, Free: 3.4 pg/mL (ref 3.0–4.7)

## 2019-01-06 LAB — T4, FREE: Free T4: 1.3 ng/dL (ref 0.8–1.4)

## 2019-01-19 ENCOUNTER — Ambulatory Visit (INDEPENDENT_AMBULATORY_CARE_PROVIDER_SITE_OTHER): Payer: Medicaid Other | Admitting: "Endocrinology

## 2019-01-19 ENCOUNTER — Encounter (INDEPENDENT_AMBULATORY_CARE_PROVIDER_SITE_OTHER): Payer: Self-pay | Admitting: "Endocrinology

## 2019-01-19 ENCOUNTER — Other Ambulatory Visit: Payer: Self-pay

## 2019-01-19 VITALS — BP 108/70 | HR 80 | Wt 142.6 lb

## 2019-01-19 DIAGNOSIS — E663 Overweight: Secondary | ICD-10-CM | POA: Diagnosis not present

## 2019-01-19 DIAGNOSIS — Q909 Down syndrome, unspecified: Secondary | ICD-10-CM | POA: Diagnosis not present

## 2019-01-19 DIAGNOSIS — E049 Nontoxic goiter, unspecified: Secondary | ICD-10-CM | POA: Diagnosis not present

## 2019-01-19 DIAGNOSIS — E063 Autoimmune thyroiditis: Secondary | ICD-10-CM

## 2019-01-19 NOTE — Progress Notes (Signed)
Subjective:  Patient Name: Tony Copeland Date of Birth: 01-22-2000  MRN: [id number]  [name]  presents at his clinic visit today for follow-up of his acquired primary hypothyroidism, thyroiditis, goiter, and being overweight in the setting of Down's syndrome.  HISTORY OF PRESENT ILLNESS:   [name] is a 19 y.o. Caucasian young man.  [name] was accompanied by his mother.  1. [name] was first referred to me on 10/23/2004 by his PCP, Dr. Caroll Rancher, of [facility], at age 64 for evaluation and management of hypothyroidism in the setting of Down's Syndrome.   A. He had had a several year history of having TSH values in the 4.0-5.0 range. Because the child was an active little boy and because these values were "within normal" according to the lab reference values, no actions were taken. In retrospect, the child tended to be cold frequently. He also had problems with constipation. His past medical history was positive for ADHD and for intussusception repair. Family history was positive for the mother having a goiter and the maternal grandmother taking thyroid medication. Labs on 10/18/04 showed a TSH of 7.328 and a free T4 1.19. He was started on Synthroid, 25 mcg per day at that time.   B. After two months on Synthroid therapy he was brighter, more active, and more interactive. We have continued to treat him with Synthroid ever since, with gradual increases in doses as indicated by serial TFT measurements. Our goal has been to maintain his TSH in the "ideal" range of 1.0-2.0, which is the physiologically normal TSH range for 2/3ds of the normal population.  2. The [facility] was on 07/14/18. At that visit I continued his Synthroid dose of 100 mcg/day.   A. In the interim, he has been healthy. He has not had any further heart palpitations. He continues to have intermittent problems with his scalp flaking. His dermatologist recommended a special  selsun shampoo that he uses occasionally.   B. He is still being treated for ADHD with Adderall XR.  C. 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 He still walks his dog and rides his bike at times.   3. Pertinent Review of Systems:  Constitutional: [name] feels "good". Mother says that he has been doing well overall. He remains very bright and sharp mentally, but is still frustrated by the covid-19 restrictions on his physical activities, especially Special Olympics. Eyes: Vision is good overall. He only needs his glasses for reading. There are no other significant eye complaints. His last eye exam occurred on 04/02/18. Family has not scheduled a follow up exam yet.  Neck: [name] has no complaints of anterior neck swelling, soreness, tenderness,  pressure, discomfort, or difficulty swallowing.  Heart: He had an episode of palpitations after exercise in October 2017. EKG and ECHO were normal. The palpitations subsequently resolved and have not recurred. Heart rate increases with exercise or other physical activity. AJ has no complaints of chest pain, or chest pressure.  Gastrointestinal: Appetite is good. He is sensitive to dairy products. He does not have any constipation as long as he takes docusate three times per week. He does not have any other complaints of excessive hunger, acid reflux, upset stomach, stomach aches or pains, or diarrhea. Legs: Muscle mass and strength seem normal. There are no complaints of numbness, tingling, burning, or pain. No edema is noted. Feet: There are  no obvious foot problems. There are no complaints of numbness, tingling, burning, or pain. No edema is noted. Neurological: His coordination and strength remain good.   PAST MEDICAL, FAMILY, AND SOCIAL HISTORY:  Past Medical History:  Diagnosis Date  . ADHD (attention deficit hyperactivity disorder)   . Constipation - functional 09/29/2011   Miralax  prn  . Delayed linear growth   . Down syndrome   . Down's syndrome   . Goiter   . Hypothyroidism, acquired, autoimmune   . Intussusception of small intestine (HCC) 03/1999   meckel's leading point  . Isosexual precocity   . Thyroiditis, autoimmune     Family History  Problem Relation Age of Onset  . Thyroid disease Mother   . Thyroid disease Maternal Grandmother   . Hypertension Maternal Grandmother   . Cancer Maternal Grandfather        bone  . Heart disease Maternal Grandfather   . Cancer Paternal Grandmother        breast  . Diabetes Neg Hx      Current Outpatient Medications:  .  amphetamine-dextroamphetamine (ADDERALL XR) 20 MG 24 hr capsule, Take 1 capsule (20 mg total) by mouth daily with breakfast., Disp: 30 capsule, Rfl: 0 .  [START ON 02/15/2019] amphetamine-dextroamphetamine (ADDERALL XR) 20 MG 24 hr capsule, Take 1 capsule (20 mg total) by mouth daily with breakfast., Disp: 30 capsule, Rfl: 0 .  ketoconazole (NIZORAL) 2 % shampoo, Apply 1 application topically 2 (two) times a week., Disp: 120 mL, Rfl: 3 .  levothyroxine (SYNTHROID) 100 MCG tablet, Take 1 tablet (100 mcg total) by mouth daily., Disp: 30 tablet, Rfl: 5 .  amphetamine-dextroamphetamine (ADDERALL XR) 20 MG 24 hr capsule, Take 1 capsule (20 mg total) by mouth daily with breakfast., Disp: 30 capsule, Rfl: 0  Allergies as of 01/19/2019  . (No Known Allergies)   1. School: He is in the 12th grade at [employer]. 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  2. 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  He also now works at a coffee shop two days a week.  3. Primary Care Provider: Georgiann Hahn, MD  4. He is now covered by Medicaid.   REVIEW OF SYSTEMS: There are no other significant problems involving [name]'s other body systems.   Objective:   BP 108/70   Pulse 80   Wt 142 lb 9.6 oz (64.7 kg)   BMI 25.67 kg/m   Wt  Readings from Last 3 Encounters:  01/19/19 142 lb 9.6 oz (64.7 kg)  12/15/18 138 lb 14.4 oz (63 kg)  08/18/18 139 lb 9.6 oz (63.3 kg)    Ht Readings from Last 3 Encounters:  12/15/18 5' 2.5" (1.588 m)  08/18/18 5' 2.75" (1.594 m)  07/14/18 5' 2.91" (1.598 m) (<1 %, Z= -2.37)*   * Growth percentiles are based on CDC (Boys, 2-20 Years) data.   Body mass index is 25.67 kg/m. Facility age limit for growth percentiles is 20 years.  Body surface area is 1.69 meters squared.  Constitutional: [name] looks healthy and appears physically and emotionally well. He has gained 5 pounds since his last visit. He is alert and right. He engaged will with his mother and with me. He is very bright and sharp for a young man with Down's syndrome. His affect is somewhat flat today. His insight is fairly good.  Face: He has a typical Downs facies, with some asymmetrical rotation  of the face and jaw.  Eyes: The eyes are typical for Down's syndrome. There is no arcus or proptosis.  Mouth: The oropharynx appears normal. The tongue appears normal. There is no obvious gingivitis. He has a narrow jaw with some dental crowding. There is normal oral moisture.  Neck: There are no bruits present. The thyroid gland appears normal in size. The thyroid gland is slightly more enlarged at about 21+ grams in size. The consistency of the thyroid gland is normal. There is no thyroid tenderness to palpation. Lungs: The lungs are clear. Air movement is good. Heart: The heart rhythm and rate appear normal. Heart sounds S1 and S2 are normal. I do not appreciate any pathologic heart murmurs. Abdomen: The abdominal size is normal. Bowel sounds are normal. The abdomen is soft and non-tender. There is no obviously palpable hepatomegaly, splenomegaly, or other masses.  Arms: Muscle mass appears appropriate for age. Radial pulses appear normal. Hands: There is no obvious tremor. Phalangeal and metacarpophalangeal joints appear normal. Palms  are normal. Legs: Muscle mass appears appropriate for age. There is no edema.  Neurologic: Muscle strength is normal 5+ in both the upper and the lower extremities. Muscle tone appears normal. Sensation to touch is normal in the legs.  LAB DATA:  Results for orders placed or performed in visit on 11/22/18  Novel Coronavirus, NAA ([facility])   Specimen: Nasopharyngeal(NP) swabs in vial transport medium   NASOPHARYNGE  TESTING  Result Value Ref Range   SARS-CoV-2, NAA Not Detected Not Detected   01/05/19: TSH 0.86, free T4 1.3, free T3 3.4  07/14/18: TSH 1.07, free T4 1.3, free T3 3.1  03/24/18: TSH 1.42, free T4 1.0, free T3 3.1  09/24/17: TSH 1.82, free T4 1.3, free T3 3.2  08/13/16: TSH 2.68, free T4 1.38, free T3 2.7  10/19/15: TSH 3.04, free T4 1.3, free T3 3.04; normal CBC  06/13/15: HbA1c 5.0%; TSH 2.34, free T4 1.3, free T3 3.2  11/24/14: HbA1c 5.0%; TSH 1.899, free T4 1.20, free T3 3.1; C-peptide 3.11 (ref 0.80-3.90)  05/24/14: TSH 2.338, free T4 1.05, free T3 3.3; C-peptide 2.92 (ref 0.80-3.90)  11/14/13: TSH 1.946, free T4 1.21, free T3 3.5   05/18/13: TSH 1.541, free T4 1.27, free T3 3.2  11/09/12: TSH 1.454, free T4 1.39, free T3 3.4  04/30/12: TSH 1.407, free T4 1.28, free T3 3.5  11/03/11: TSH 1.366, free T4 1.41, free T3 3.6   Assessment and Plan:   ASSESSMENT:  1. Hypothyroid, secondary to Hashimoto's disease:   A. His thyroid function tests in March and July 2020 were mid-euthyroid and within the ideal treatment goal range of 1.0-2.0. His TSH of 0.86 is just below the goal range.   B. The recent decrease in TSH and increase in free T3 could be due to decreased metabolism of his Synthroid, to some recent inflammation within his thyroid gland, or other causes. He is clinically euthyroid today.  C. His current Synthroid dose is acceptable for him now. However, if his TSH decreases further, we may need to decrease his synthroid dosage. .    D.  We will repeat his  TFTs in 3 months and 6 months.   2-3. Goiter/thyroiditis:   A. His thyroid gland is a bit larger today, indicating some recent increase in inflammation of the gland.   B. He has not been complaining of any symptoms. Mom has not seen him favoring his anterior neck. 4. Linear growth delay: [name] has plateaued in height, c/w  his age.   5. Down's syndrome: [name] is blessed to have a good family that provides him emotional support and ensures that he is as active athletically and socially as he can be.  He misses participating in S.O. activities and being with his friends in school. Family works hard to keep him busy.   6. Overweight: [name] has gained 5 pound in the past 6 months. Although he is much more muscular and has much less body fat than the average young adult with Down's syndrome, he has not been exercising at his previous intense level, so some weight gain was expected. He needs to exercise more. Marland Kitchen  PLAN:  1. Diagnostic: We reviewed his lab results from March, July, and December 2020. Repeat TFTs in 3 months and in 6 months.   2. Therapeutic: Continue the Synthroid dose of 100 mcg/day.   3. Patient education: We discussed the likelihood that his Hashimoto's thyroiditis will eventually destroy more and more thyrocytes, resulting in larger doses of Synthroid over time. Also discussed the importance of healthy diet and increasing his  level of physical activity.  4. Follow-up: 6 months  Level of Service: This visit lasted 55 minutes.   Tillman Sers, MD, CDE Pediatric and Adult Endocrinology

## 2019-01-19 NOTE — Patient Instructions (Signed)
Follow up visit in 6 months. Please repeat lab tests in April and again 1-2 weeks prior to his next visit.

## 2019-03-06 ENCOUNTER — Ambulatory Visit: Payer: Medicaid Other | Attending: Internal Medicine

## 2019-03-06 DIAGNOSIS — Z23 Encounter for immunization: Secondary | ICD-10-CM

## 2019-03-06 NOTE — Progress Notes (Signed)
   Covid-19 Vaccination Clinic  Name:  Tony Copeland    MRN: [id number] DOB: November 13, 2000  03/06/2019  [name] was observed post Covid-19 immunization for 15 minutes without incidence. He was provided with Vaccine Information Sheet and instruction to access the V-Safe system.   [name] was instructed to call 911 with any severe reactions post vaccine: Marland Kitchen Difficulty breathing  . Swelling of your face and throat  . A fast heartbeat  . A bad rash all over your body  . Dizziness and weakness    Immunizations Administered    Name Date Dose VIS Date Route   Pfizer COVID-19 Vaccine 03/06/2019  8:54 AM 0.3 mL 12/17/2018 Intramuscular   Manufacturer: [employer], [employer]   Lot: [id number]   NDC: [address]-7

## 2019-03-09 ENCOUNTER — Ambulatory Visit (INDEPENDENT_AMBULATORY_CARE_PROVIDER_SITE_OTHER): Payer: Self-pay | Admitting: Pediatrics

## 2019-03-09 ENCOUNTER — Encounter: Payer: Self-pay | Admitting: Pediatrics

## 2019-03-09 ENCOUNTER — Other Ambulatory Visit: Payer: Self-pay

## 2019-03-09 VITALS — BP 100/60 | Ht 62.2 in | Wt 138.9 lb

## 2019-03-09 DIAGNOSIS — F902 Attention-deficit hyperactivity disorder, combined type: Secondary | ICD-10-CM

## 2019-03-09 MED ORDER — AMPHETAMINE-DEXTROAMPHET ER 20 MG PO CP24: 20.0000 mg | ORAL_CAPSULE | Freq: Every day | ORAL | 0 refills | Status: DC

## 2019-03-09 NOTE — Progress Notes (Signed)
ADHD meds refilled after normal weight and Blood pressure. Doing well on present dose. See again in 3 months  

## 2019-03-09 NOTE — Patient Instructions (Signed)

## 2019-03-22 ENCOUNTER — Telehealth: Payer: Self-pay | Admitting: Pediatrics

## 2019-03-22 NOTE — Telephone Encounter (Signed)
[employer] form on your desk to to fill out please

## 2019-03-24 NOTE — Telephone Encounter (Signed)
Child medical report filled  

## 2019-03-30 ENCOUNTER — Ambulatory Visit: Payer: Medicaid Other | Attending: Internal Medicine

## 2019-03-30 DIAGNOSIS — Z23 Encounter for immunization: Secondary | ICD-10-CM

## 2019-03-30 NOTE — Progress Notes (Signed)
   Covid-19 Vaccination Clinic  Name:  Tony Copeland    MRN: [id number] DOB: 09-24-2000  03/30/2019  Mr. Thiam was observed post Covid-19 immunization for 15 minutes without incident. He was provided with Vaccine Information Sheet and instruction to access the V-Safe system.   Mr. Fyock was instructed to call 911 with any severe reactions post vaccine: Marland Kitchen Difficulty breathing  . Swelling of face and throat  . A fast heartbeat  . A bad rash all over body  . Dizziness and weakness   Immunizations Administered    Name Date Dose VIS Date Route   Pfizer COVID-19 Vaccine 03/30/2019 11:36 AM 0.3 mL 12/17/2018 Intramuscular   Manufacturer: [employer], [employer]   Lot: [id number]   NDC: [address]-0

## 2019-04-19 ENCOUNTER — Telehealth: Payer: Self-pay | Admitting: Pediatrics

## 2019-04-19 MED ORDER — AMPHETAMINE-DEXTROAMPHET ER 20 MG PO CP24: 20.0000 mg | ORAL_CAPSULE | Freq: Every day | ORAL | 0 refills | Status: DC

## 2019-04-19 NOTE — Telephone Encounter (Signed)
[facility] not [facility] and can we [facility] to [facility] please

## 2019-04-19 NOTE — Telephone Encounter (Signed)
Refilled ADHD medications--[facility] [place]

## 2019-04-29 ENCOUNTER — Other Ambulatory Visit (INDEPENDENT_AMBULATORY_CARE_PROVIDER_SITE_OTHER): Payer: Self-pay | Admitting: "Endocrinology

## 2019-06-08 ENCOUNTER — Other Ambulatory Visit: Payer: Self-pay

## 2019-06-08 ENCOUNTER — Ambulatory Visit (INDEPENDENT_AMBULATORY_CARE_PROVIDER_SITE_OTHER): Payer: Self-pay | Admitting: Pediatrics

## 2019-06-08 VITALS — BP 90/70 | Ht 64.0 in | Wt 138.0 lb

## 2019-06-08 DIAGNOSIS — F902 Attention-deficit hyperactivity disorder, combined type: Secondary | ICD-10-CM

## 2019-06-08 MED ORDER — AMPHETAMINE-DEXTROAMPHET ER 20 MG PO CP24: 20.0000 mg | ORAL_CAPSULE | Freq: Every day | ORAL | 0 refills | Status: DC

## 2019-06-09 ENCOUNTER — Encounter: Payer: Self-pay | Admitting: Pediatrics

## 2019-06-09 NOTE — Progress Notes (Signed)
ADHD meds refilled after normal weight and Blood pressure. Doing well on present dose. See again in 3 months  

## 2019-06-22 ENCOUNTER — Ambulatory Visit (INDEPENDENT_AMBULATORY_CARE_PROVIDER_SITE_OTHER): Payer: Medicaid Other | Admitting: Pediatrics

## 2019-06-22 ENCOUNTER — Other Ambulatory Visit: Payer: Self-pay

## 2019-06-22 VITALS — BP 98/66 | Ht 62.75 in | Wt 136.1 lb

## 2019-06-22 DIAGNOSIS — F909 Attention-deficit hyperactivity disorder, unspecified type: Secondary | ICD-10-CM

## 2019-06-22 DIAGNOSIS — E559 Vitamin D deficiency, unspecified: Secondary | ICD-10-CM | POA: Diagnosis not present

## 2019-06-22 DIAGNOSIS — E063 Autoimmune thyroiditis: Secondary | ICD-10-CM

## 2019-06-22 DIAGNOSIS — Z0001 Encounter for general adult medical examination with abnormal findings: Secondary | ICD-10-CM | POA: Diagnosis not present

## 2019-06-22 DIAGNOSIS — Z0000 Encounter for general adult medical examination without abnormal findings: Secondary | ICD-10-CM

## 2019-06-22 MED ORDER — MUPIROCIN 2 % EX OINT: TOPICAL_OINTMENT | CUTANEOUS | 12 refills | Status: AC

## 2019-06-24 ENCOUNTER — Encounter: Payer: Self-pay | Admitting: Pediatrics

## 2019-06-24 DIAGNOSIS — E559 Vitamin D deficiency, unspecified: Secondary | ICD-10-CM | POA: Insufficient documentation

## 2019-06-24 NOTE — Patient Instructions (Signed)

## 2019-06-24 NOTE — Progress Notes (Signed)
Adolescent Well Care Visit Tony Copeland is a 19 y.o. male who is here for well care.    PCP:  Georgiann Hahn, MD   History was provided by the patient and mother.  Confidentiality was discussed with the patient and, if applicable, with caregiver as well.    Current Issues: Current concerns include ADHD and hypothyroid. Trisomy 21.   Nutrition: Nutrition/Eating Behaviors: good Adequate calcium in diet?: yes Supplements/ Vitamins: yes  Exercise/ Media: Play any Sports?/ Exercise: track Screen Time:  < 2 hours Media Rules or Monitoring?: yes  Sleep:  Sleep: good  Social Screening: Lives with:  parents Parental relations:  good Activities, Work, and Regulatory affairs officer?: yes Concerns regarding behavior with peers?  none Stressors of note: no  Education: Out of school   Confidential Social History: Tobacco?  no Secondhand smoke exposure?  no Drugs/ETOH?  no  Sexually Active?  no   Pregnancy Prevention: n/a  Safe at home, in school & in relationships?  Yes Safe to self?  Yes   Screenings: Patient has a dental home: yes  The patient completed the Rapid Assessment for Adolescent Preventive Services screening questionnaire and the following topics were identified as risk factors and discussed: healthy eating, exercise, seatbelt use, bullying, abuse/trauma, weapon use, tobacco use, marijuana use and drug use  In addition, the following topics were discussed as part of anticipatory guidance condom use, birth control, sexuality, suicidality/self harm, mental health issues, social isolation, school problems, family problems and screen time.  PHQ-9 completed and results indicated no risk  Physical Exam:  Vitals:   06/22/19 1218  BP: 98/66  Weight: 136 lb 1.6 oz (61.7 kg)  Height: 5' 2.75" (1.594 m)   BP 98/66   Ht 5' 2.75" (1.594 m)   Wt 136 lb 1.6 oz (61.7 kg)   BMI 24.30 kg/m  Body mass index: body mass index is 24.3 kg/m. Growth percentile SmartLinks can only be  used for patients less than 63 years old.   Hearing Screening   125Hz  250Hz  500Hz  1000Hz  2000Hz  3000Hz  4000Hz  6000Hz  8000Hz   Right ear:   20 20 20 20 20     Left ear:   20 20 20 20 20     Vision Screening Comments: Pt did not bring glasses  General Appearance:   alert, oriented, no acute distress and well nourished  HENT: Normocephalic, no obvious abnormality, conjunctiva clear  Mouth:   Normal appearing teeth, no obvious discoloration, dental caries, or dental caps  Neck:   Supple; thyroid: no enlargement, symmetric, no tenderness/mass/nodules  Chest normal  Lungs:   Clear to auscultation bilaterally, normal work of breathing  Heart:   Regular rate and rhythm, S1 and S2 normal, no murmurs;   Abdomen:   Soft, non-tender, no mass, or organomegaly  GU normal male genitals, no testicular masses or hernia  Musculoskeletal:   Tone and strength strong and symmetrical, all extremities               Lymphatic:   No cervical adenopathy  Skin/Hair/Nails:   Skin warm, dry and intact, no rashes, no bruises or petechiae  Neurologic:   Strength, gait, and coordination normal and age-appropriate     Assessment and Plan:   Male with trisomy 70   BMI is appropriate for age  Hearing screening result:normal Vision screening result: normal  Counseling provided for all of the  components  Orders Placed This Encounter  Procedures  . HgB A1c  . COMPLETE METABOLIC PANEL WITH GFR  .  Lipid Profile  . CBC with Differential  . Vitamin D (25 hydroxy)   Screening labs for young adult   Return in about 3 months (around 09/22/2019).Marcha Solders, MD

## 2019-07-16 LAB — CBC WITH DIFFERENTIAL/PLATELET
Absolute Monocytes: 439 cells/uL (ref 200–950)
Basophils Absolute: 112 cells/uL (ref 0–200)
Basophils Relative: 1.3 %
Eosinophils Absolute: 34 cells/uL (ref 15–500)
Eosinophils Relative: 0.4 %
HCT: 50.2 % — ABNORMAL HIGH (ref 38.5–50.0)
Hemoglobin: 17.1 g/dL (ref 13.2–17.1)
Lymphs Abs: 1875 cells/uL (ref 850–3900)
MCH: 33.1 pg — ABNORMAL HIGH (ref 27.0–33.0)
MCHC: 34.1 g/dL (ref 32.0–36.0)
MCV: 97.1 fL (ref 80.0–100.0)
MPV: 9.9 fL (ref 7.5–12.5)
Monocytes Relative: 5.1 %
Neutro Abs: 6140 cells/uL (ref 1500–7800)
Neutrophils Relative %: 71.4 %
Platelets: 275 10*3/uL (ref 140–400)
RBC: 5.17 10*6/uL (ref 4.20–5.80)
RDW: 13.1 % (ref 11.0–15.0)
Total Lymphocyte: 21.8 %
WBC: 8.6 10*3/uL (ref 3.8–10.8)

## 2019-07-16 LAB — COMPLETE METABOLIC PANEL WITH GFR
AG Ratio: 1.9 (calc) (ref 1.0–2.5)
ALT: 12 U/L (ref 9–46)
AST: 21 U/L (ref 10–40)
Albumin: 4.3 g/dL (ref 3.6–5.1)
Alkaline phosphatase (APISO): 73 U/L (ref 36–130)
BUN/Creatinine Ratio: 12 (calc) (ref 6–22)
BUN: 17 mg/dL (ref 7–25)
CO2: 30 mmol/L (ref 20–32)
Calcium: 9.4 mg/dL (ref 8.6–10.3)
Chloride: 101 mmol/L (ref 98–110)
Creat: 1.43 mg/dL — ABNORMAL HIGH (ref 0.60–1.35)
GFR, Est African American: 81 mL/min/{1.73_m2} (ref >=60)
GFR, Est Non African American: 70 mL/min/{1.73_m2} (ref >=60)
Globulin: 2.3 g/dL (calc) (ref 1.9–3.7)
Glucose, Bld: 91 mg/dL (ref 65–139)
Potassium: 4.3 mmol/L (ref 3.5–5.3)
Sodium: 141 mmol/L (ref 135–146)
Total Bilirubin: 0.6 mg/dL (ref 0.2–1.2)
Total Protein: 6.6 g/dL (ref 6.1–8.1)

## 2019-07-16 LAB — HEMOGLOBIN A1C
Hgb A1c MFr Bld: 4.8 % of total Hgb (ref <5.7)
Mean Plasma Glucose: 91 (calc)
eAG (mmol/L): 5 (calc)

## 2019-07-16 LAB — LIPID PANEL
Cholesterol: 165 mg/dL (ref <200)
HDL: 37 mg/dL — ABNORMAL LOW (ref >=40)
LDL Cholesterol (Calc): 107 mg/dL (calc) — ABNORMAL HIGH
Non-HDL Cholesterol (Calc): 128 mg/dL (calc) (ref <130)
Total CHOL/HDL Ratio: 4.5 (calc) (ref <5.0)
Triglycerides: 116 mg/dL (ref <150)

## 2019-07-16 LAB — TSH: TSH: 1.25 mIU/L (ref 0.40–4.50)

## 2019-07-16 LAB — T4, FREE: Free T4: 1.5 ng/dL — ABNORMAL HIGH (ref 0.8–1.4)

## 2019-07-16 LAB — VITAMIN D 25 HYDROXY (VIT D DEFICIENCY, FRACTURES): Vit D, 25-Hydroxy: 38 ng/mL (ref 30–100)

## 2019-07-16 LAB — T3, FREE: T3, Free: 3.3 pg/mL (ref 3.0–4.7)

## 2019-07-20 ENCOUNTER — Encounter (INDEPENDENT_AMBULATORY_CARE_PROVIDER_SITE_OTHER): Payer: Self-pay | Admitting: "Endocrinology

## 2019-07-20 ENCOUNTER — Ambulatory Visit (INDEPENDENT_AMBULATORY_CARE_PROVIDER_SITE_OTHER): Payer: Medicaid Other | Admitting: "Endocrinology

## 2019-07-20 ENCOUNTER — Other Ambulatory Visit: Payer: Self-pay

## 2019-07-20 VITALS — BP 116/74 | HR 86 | Ht 62.48 in | Wt 133.6 lb

## 2019-07-20 DIAGNOSIS — R718 Other abnormality of red blood cells: Secondary | ICD-10-CM

## 2019-07-20 DIAGNOSIS — E049 Nontoxic goiter, unspecified: Secondary | ICD-10-CM | POA: Diagnosis not present

## 2019-07-20 DIAGNOSIS — E663 Overweight: Secondary | ICD-10-CM

## 2019-07-20 DIAGNOSIS — E7800 Pure hypercholesterolemia, unspecified: Secondary | ICD-10-CM

## 2019-07-20 DIAGNOSIS — E063 Autoimmune thyroiditis: Secondary | ICD-10-CM

## 2019-07-20 DIAGNOSIS — R7989 Other specified abnormal findings of blood chemistry: Secondary | ICD-10-CM

## 2019-07-20 DIAGNOSIS — Q909 Down syndrome, unspecified: Secondary | ICD-10-CM | POA: Diagnosis not present

## 2019-07-20 NOTE — Patient Instructions (Signed)
Follow up visit in 6 months. Please repeat thyroid tests 1-2 weeks prior.  

## 2019-07-20 NOTE — Progress Notes (Signed)
Subjective:  Patient Name: Tony Copeland Date of Birth: 2000/04/13  MRN: [id number]  Tony Copeland  presents at his clinic visit today for follow-up of his acquired primary hypothyroidism, thyroiditis, goiter, and being overweight in the setting of Down's syndrome.  HISTORY OF PRESENT ILLNESS:   [name] is a 19 y.o. Caucasian young man.  [name] was accompanied by his mother.  1. [name] was first referred to me on 10/23/2004 by his PCP, Dr. Caroll Rancher, of [facility], at age 39 for evaluation and management of hypothyroidism in the setting of Down's Syndrome.   A. He had had a several year history of having TSH values in the 4.0-5.0 range. Because the child was an active little boy and because these values were "within normal" according to the lab reference values, no actions were taken. In retrospect, the child tended to be cold frequently. He also had problems with constipation. His past medical history was positive for ADHD and for intussusception repair. Family history was positive for the mother having a goiter and the maternal grandmother taking thyroid medication. Labs on 10/18/04 showed a TSH of 7.328 and a free T4 1.19. He was started on Synthroid, 25 mcg per day at that time.   B. After two months on Synthroid therapy he was brighter, more active, and more interactive. We have continued to treat him with Synthroid ever since, with gradual increases in doses as indicated by serial TFT measurements. Our goal has been to maintain his TSH in the "ideal" range of 1.0-2.0, which is the physiologically normal TSH range for 2/3ds of the normal population.  2. The [facility] was on 01/19/19. At that visit I continued his Synthroid dose of 100 mcg/day.   A. In the interim, he has been healthy. He has had both covid vaccinations.  B. His scalp flaking has gradually improved.  B. He is still being treated for ADHD with Adderall XR.  C. He has  been more active in the past several months. 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   3. Pertinent Review of Systems:  Constitutional: [name] feels "fine". Mother says that he has been doing well overall. He remains very bright and sharp mentally. He is very social and is pleased that the covid restrictions are easing. Eyes: Vision is good overall. He only needs his glasses for reading. There are no other significant eye complaints. His last eye exam occurred on 04/02/18. Family has not scheduled a follow up exam yet.  Neck: [name] has no complaints of anterior neck swelling, soreness, tenderness,  pressure, discomfort, or difficulty swallowing.  Heart: He had an episode of palpitations after exercise in October 2017. EKG and ECHO were normal. The palpitations subsequently resolved and have not recurred. Heart rate increases with exercise or other physical activity. Tony Copeland has no complaints of chest pain, or chest pressure.  Gastrointestinal: Appetite is good. He is sensitive to dairy products. He does not have any constipation as long as he takes docusate three times per week. He does not have any other complaints of excessive hunger, acid reflux, upset stomach, stomach aches or pains, or diarrhea. Legs: Muscle mass and strength seem normal. There are no complaints of numbness, tingling, burning, or pain. No edema is noted. Feet: There are no obvious foot problems. There are no complaints of numbness, tingling, burning, or pain. No edema is noted. Neurological: His coordination and strength remain good.   PAST MEDICAL, FAMILY,  AND SOCIAL HISTORY:  Past Medical History:  Diagnosis Date  . ADHD (attention deficit hyperactivity disorder)   . Constipation - functional 09/29/2011   Miralax prn  . Delayed linear growth   . Down syndrome   . Down's syndrome   . Goiter   . Hypothyroidism, acquired, autoimmune   . Intussusception of small intestine (HCC) 03/1999   meckel's leading point  .  Isosexual precocity   . Thyroid disease    Phreesia 06/20/2019  . Thyroiditis, autoimmune     Family History  Problem Relation Age of Onset  . Thyroid disease Mother   . Thyroid disease Maternal Grandmother   . Hypertension Maternal Grandmother   . Cancer Maternal Grandfather        bone  . Heart disease Maternal Grandfather   . Cancer Paternal Grandmother        breast  . Diabetes Neg Hx      Current Outpatient Medications:  .  amphetamine-dextroamphetamine (ADDERALL XR) 20 MG 24 hr capsule, Take 1 capsule (20 mg total) by mouth daily with breakfast., Disp: 31 capsule, Rfl: 0 .  ketoconazole (NIZORAL) 2 % shampoo, Apply 1 application topically 2 (two) times a week., Disp: 120 mL, Rfl: 3 .  SYNTHROID 100 MCG tablet, TAKE 1 TABLET BY MOUTH EVERY DAY, Disp: 90 tablet, Rfl: 1 .  amphetamine-dextroamphetamine (ADDERALL XR) 20 MG 24 hr capsule, Take 1 capsule (20 mg total) by mouth daily with breakfast., Disp: 31 capsule, Rfl: 0 .  [START ON 08/08/2019] amphetamine-dextroamphetamine (ADDERALL XR) 20 MG 24 hr capsule, Take 1 capsule (20 mg total) by mouth daily with breakfast. (Patient not taking: Reported on 07/20/2019), Disp: 30 capsule, Rfl: 0  Allergies as of 07/20/2019  . (No Known Allergies)   1. School: He will graduate from the 12th grade at [employer] in June 2022. 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   2. 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  He also now works at a coffee shop two days a week.  3. Primary Care Provider: Georgiann Hahn, MD  4. He is now covered by Medicaid.   REVIEW OF SYSTEMS: There are no other significant problems involving [name]'s other body systems.   Objective:   BP 116/74   Pulse 86   Ht 5' 2.48" (1.587 m)   Wt 133 lb 9.6 oz (60.6 kg)   BMI 24.06 kg/m   Wt Readings from Last 3 Encounters:  07/20/19 133 lb 9.6 oz (60.6 kg)  06/22/19 136 lb 1.6  oz (61.7 kg)  06/08/19 138 lb (62.6 kg)    Ht Readings from Last 3 Encounters:  07/20/19 5' 2.48" (1.587 m)  06/22/19 5' 2.75" (1.594 m)  06/08/19  (1.626 m)   Body mass index is 24.06 kg/m. Facility age limit for growth percentiles is 20 years.  Body surface area is 1.63 meters squared.  Constitutional: [name] looks healthy and appears physically and emotionally well. He has lost 9 pounds since his last visit. He is alert and right. He engaged will with his mother and with me. He is very bright and sharp for a young man with Down's syndrome. His affect is more animated today. His insight is fairly good.  Face: He has a typical Downs facies, with some asymmetrical rotation of the face and jaw.  Eyes: The eyes are typical for Down's syndrome. There is no arcus or proptosis.  Mouth:  The oropharynx appears normal. The tongue appears normal. There is no obvious gingivitis. He has a narrow jaw with some dental crowding. There is normal oral moisture.  Neck: There are no bruits present. The thyroid gland appears normal in size. The thyroid gland is enlarged, but a bit smaller at about 21 grams in size. The consistency of the thyroid gland is normal. There is no thyroid tenderness to palpation. Lungs: The lungs are clear. Air movement is good. Heart: The heart rhythm and rate appear normal. Heart sounds S1 and S2 are normal. I do not appreciate any pathologic heart murmurs. Abdomen: The abdominal size is normal. Bowel sounds are normal. The abdomen is soft and non-tender. There is no obviously palpable hepatomegaly, splenomegaly, or other masses.  Arms: Muscle mass appears appropriate for age.  Hands: There is no obvious tremor. Phalangeal and metacarpophalangeal joints appear normal. Palms are normal. Legs: Muscle mass appears appropriate for age. There is no edema.  Neurologic: Muscle strength is normal 5+ in both the upper and the lower extremities. Muscle tone appears normal. Sensation to  touch is normal in the legs.  LAB DATA:  Results for orders placed or performed in visit on 06/22/19  HgB A1c  Result Value Ref Range   Hgb A1c MFr Bld 4.8 <5.7 % of total Hgb   Mean Plasma Glucose 91 (calc)   eAG (mmol/L) 5.0 (calc)  COMPLETE METABOLIC PANEL WITH GFR  Result Value Ref Range   Glucose, Bld 91 65 - 139 mg/dL   BUN 17 7 - 25 mg/dL   Creat 8.67 (H) 5.44 - 1.35 mg/dL   GFR, Est Non African American 70 > OR = 60 mL/min/1.28m2   GFR, Est African American 81 > OR = 60 mL/min/1.71m2   BUN/Creatinine Ratio 12 6 - 22 (calc)   Sodium 141 135 - 146 mmol/L   Potassium 4.3 3.5 - 5.3 mmol/L   Chloride 101 98 - 110 mmol/L   CO2 30 20 - 32 mmol/L   Calcium 9.4 8.6 - 10.3 mg/dL   Total Protein 6.6 6.1 - 8.1 g/dL   Albumin 4.3 3.6 - 5.1 g/dL   Globulin 2.3 1.9 - 3.7 g/dL (calc)   AG Ratio 1.9 1.0 - 2.5 (calc)   Total Bilirubin 0.6 0.2 - 1.2 mg/dL   Alkaline phosphatase (APISO) 73 36 - 130 U/L   AST 21 10 - 40 U/L   ALT 12 9 - 46 U/L  Lipid Profile  Result Value Ref Range   Cholesterol 165 <200 mg/dL   HDL 37 (L) > OR = 40 mg/dL   Triglycerides 920 <100 mg/dL   LDL Cholesterol (Calc) 107 (H) mg/dL (calc)   Total CHOL/HDL Ratio 4.5 <5.0 (calc)   Non-HDL Cholesterol (Calc) 128 <130 mg/dL (calc)  CBC with Differential  Result Value Ref Range   WBC 8.6 3.8 - 10.8 Thousand/uL   RBC 5.17 4.20 - 5.80 Million/uL   Hemoglobin 17.1 13.2 - 17.1 g/dL   HCT 71.2 (H) 38 - 50 %   MCV 97.1 80.0 - 100.0 fL   MCH 33.1 (H) 27.0 - 33.0 pg   MCHC 34.1 32.0 - 36.0 g/dL   RDW 19.7 58.8 - 32.5 %   Platelets 275 140 - 400 Thousand/uL   MPV 9.9 7.5 - 12.5 fL   Neutro Abs 6,140 1,500 - 7,800 cells/uL   Lymphs Abs 1,875 850 - 3,900 cells/uL   Absolute Monocytes 439 200 - 950 cells/uL   Eosinophils Absolute 34 15 -  500 cells/uL   Basophils Absolute 112 0 - 200 cells/uL   Neutrophils Relative % 71.4 %   Total Lymphocyte 21.8 %   Monocytes Relative 5.1 %   Eosinophils Relative 0.4 %    Basophils Relative 1.3 %  Vitamin D (25 hydroxy)  Result Value Ref Range   Vit D, 25-Hydroxy 38 30 - 100 ng/mL    Labs 07/15/19: non-fasting labs: HbA1c 4.8%; TSH 1.25, free T4 1.5, free T3 3.3; CMP normal, except creatinine 1.43 (ref 0.60-1.35); CBC normal, except Hct 50.2 (ref 38-50) and MCH 33.1 (ref 27-33); cholesterol 165, triglycerides 116, HDL 37, LDL 107; 25-OH vitamin D 38  01/05/19: TSH 0.86, free T4 1.3, free T3 3.4  07/14/18: TSH 1.07, free T4 1.3, free T3 3.1  03/24/18: TSH 1.42, free T4 1.0, free T3 3.1  09/24/17: TSH 1.82, free T4 1.3, free T3 3.2  08/13/16: TSH 2.68, free T4 1.38, free T3 2.7  10/19/15: TSH 3.04, free T4 1.3, free T3 3.04; normal CBC  06/13/15: HbA1c 5.0%; TSH 2.34, free T4 1.3, free T3 3.2  11/24/14: HbA1c 5.0%; TSH 1.899, free T4 1.20, free T3 3.1; C-peptide 3.11 (ref 0.80-3.90)  05/24/14: TSH 2.338, free T4 1.05, free T3 3.3; C-peptide 2.92 (ref 0.80-3.90)  11/14/13: TSH 1.946, free T4 1.21, free T3 3.5   05/18/13: TSH 1.541, free T4 1.27, free T3 3.2  11/09/12: TSH 1.454, free T4 1.39, free T3 3.4  04/30/12: TSH 1.407, free T4 1.28, free T3 3.5  11/03/11: TSH 1.366, free T4 1.41, free T3 3.6   Assessment and Plan:   ASSESSMENT:  1. Hypothyroid, secondary to Hashimoto's disease:   A. We have adjusted Tony Copeland's Synthroid dosage over time to achieve a TSH in the goal range of 1.0-2.0.   B. His TFTs in July 2021 were quite good. He is clinically euthyroid today.  C. His current Synthroid dose is acceptable for him now. However, if his TSH decreases further, we may need to decrease his Synthroid dosage.    D.  We will repeat his TFTs in 3 months and 6 months.   2-3. Goiter/thyroiditis:   A. His thyroid gland is a bit smaller today, indicating some recent increase in inflammation of the gland.   B. He has not been complaining of any symptoms. Mom has not seen him favoring his anterior neck. His thyroiditis is clinically quiescent today. 4. Down's  syndrome: [name] is blessed to have a good family that provides him emotional support and ensures that he is as active athletically and socially as he can be.  He misses participating in S.O. activities and being with his friends in school. Family works hard to keep him busy.   5. Hypercholesterolemia: He has a lipid panel done on 07/15/19, but he was not fasting at the time. We need to repeat that study.  6. Overweight: [name] has lost 9 pound in the past 6 months. He is much more muscular and has much less body fat than the average young adult with Down's syndrome. He has also been more physically active in the past 6 months.   6-8. Elevated creatinine/ hematocrit, and MCH: Given the elevation of all 3 parameters, it is quite possible that Tony Copeland was dehydrated when these labs were obtained. It would be reasonable to repeat his fasting lipid panel, CMP, and CBC on a Monday after having a relaxing weekend. Given the fact that Tony Copeland often does not drink enough, it is reasonable to try this option.   PLAN:  1. Diagnostic: We reviewed his lab results from July 2021. Repeat fasting lipid panel, CMP, and CBC when he is well hydrated. Repeat TFTs in 6 months.   2. Therapeutic: Continue the Synthroid dose of 100 mcg/day.   3. Patient education: We discussed the likelihood that his Hashimoto's thyroiditis will eventually destroy more and more thyrocytes, resulting in larger doses of Synthroid over time. Also discussed the importance of healthy diet and increasing his  level of physical activity.  4. Follow-up: 6 months  Level of Service: This visit lasted 55 minutes.   Molli Knock, MD, CDE [facility]

## 2019-07-29 LAB — CBC WITH DIFFERENTIAL/PLATELET
Absolute Monocytes: 456 cells/uL (ref 200–950)
Basophils Absolute: 101 cells/uL (ref 0–200)
Basophils Relative: 1.9 %
Eosinophils Absolute: 111 cells/uL (ref 15–500)
Eosinophils Relative: 2.1 %
HCT: 51.4 % — ABNORMAL HIGH (ref 38.5–50.0)
Hemoglobin: 16.8 g/dL (ref 13.2–17.1)
Lymphs Abs: 2025 cells/uL (ref 850–3900)
MCH: 32.4 pg (ref 27.0–33.0)
MCHC: 32.7 g/dL (ref 32.0–36.0)
MCV: 99 fL (ref 80.0–100.0)
MPV: 9.7 fL (ref 7.5–12.5)
Monocytes Relative: 8.6 %
Neutro Abs: 2608 cells/uL (ref 1500–7800)
Neutrophils Relative %: 49.2 %
Platelets: 279 10*3/uL (ref 140–400)
RBC: 5.19 10*6/uL (ref 4.20–5.80)
RDW: 13 % (ref 11.0–15.0)
Total Lymphocyte: 38.2 %
WBC: 5.3 10*3/uL (ref 3.8–10.8)

## 2019-07-29 LAB — LIPID PANEL
Cholesterol: 161 mg/dL (ref <200)
HDL: 40 mg/dL (ref >=40)
LDL Cholesterol (Calc): 103 mg/dL (calc) — ABNORMAL HIGH
Non-HDL Cholesterol (Calc): 121 mg/dL (calc) (ref <130)
Total CHOL/HDL Ratio: 4 (calc) (ref <5.0)
Triglycerides: 88 mg/dL (ref <150)

## 2019-07-29 LAB — COMPREHENSIVE METABOLIC PANEL
AG Ratio: 2.1 (calc) (ref 1.0–2.5)
ALT: 11 U/L (ref 9–46)
AST: 20 U/L (ref 10–40)
Albumin: 4.2 g/dL (ref 3.6–5.1)
Alkaline phosphatase (APISO): 76 U/L (ref 36–130)
BUN/Creatinine Ratio: 9 (calc) (ref 6–22)
BUN: 13 mg/dL (ref 7–25)
CO2: 30 mmol/L (ref 20–32)
Calcium: 9.3 mg/dL (ref 8.6–10.3)
Chloride: 101 mmol/L (ref 98–110)
Creat: 1.39 mg/dL — ABNORMAL HIGH (ref 0.60–1.35)
Globulin: 2 g/dL (calc) (ref 1.9–3.7)
Glucose, Bld: 80 mg/dL (ref 65–99)
Potassium: 4.3 mmol/L (ref 3.5–5.3)
Sodium: 139 mmol/L (ref 135–146)
Total Bilirubin: 0.8 mg/dL (ref 0.2–1.2)
Total Protein: 6.2 g/dL (ref 6.1–8.1)

## 2019-08-09 ENCOUNTER — Encounter (INDEPENDENT_AMBULATORY_CARE_PROVIDER_SITE_OTHER): Payer: Self-pay | Admitting: *Deleted

## 2019-08-21 ENCOUNTER — Other Ambulatory Visit (INDEPENDENT_AMBULATORY_CARE_PROVIDER_SITE_OTHER): Payer: Self-pay | Admitting: "Endocrinology

## 2019-08-22 ENCOUNTER — Other Ambulatory Visit (INDEPENDENT_AMBULATORY_CARE_PROVIDER_SITE_OTHER): Payer: Self-pay

## 2019-08-22 MED ORDER — SYNTHROID 100 MCG PO TABS: 100.0000 ug | ORAL_TABLET | Freq: Every day | ORAL | 1 refills | Status: DC

## 2019-08-22 NOTE — Telephone Encounter (Signed)
Received request from pharmacy, medication must be written or faxed.  Printed refill for Dr. Fransico Michael to sign for [place] to fax.

## 2019-10-05 ENCOUNTER — Other Ambulatory Visit: Payer: Self-pay

## 2019-10-05 ENCOUNTER — Ambulatory Visit (INDEPENDENT_AMBULATORY_CARE_PROVIDER_SITE_OTHER): Payer: Medicaid Other | Admitting: Pediatrics

## 2019-10-05 VITALS — BP 120/80 | Ht 62.5 in | Wt 136.0 lb

## 2019-10-05 DIAGNOSIS — Z23 Encounter for immunization: Secondary | ICD-10-CM | POA: Diagnosis not present

## 2019-10-05 DIAGNOSIS — F902 Attention-deficit hyperactivity disorder, combined type: Secondary | ICD-10-CM

## 2019-10-06 ENCOUNTER — Encounter: Payer: Self-pay | Admitting: Pediatrics

## 2019-10-06 NOTE — Progress Notes (Signed)
ADHD meds refilled after normal weight and Blood pressure. Doing well on present dose. See again in 3 months   Presented today for flu vaccine. No new questions on vaccine. Parent was counseled on risks benefits of vaccine and parent verbalized understanding. Handout (VIS) provided for FLU vaccine. 

## 2019-10-22 ENCOUNTER — Other Ambulatory Visit: Payer: Self-pay | Admitting: Pediatrics

## 2019-10-22 MED ORDER — AMPHETAMINE-DEXTROAMPHET ER 20 MG PO CP24: 20.0000 mg | ORAL_CAPSULE | Freq: Every day | ORAL | 0 refills | Status: DC

## 2019-11-09 ENCOUNTER — Other Ambulatory Visit (INDEPENDENT_AMBULATORY_CARE_PROVIDER_SITE_OTHER): Payer: Self-pay | Admitting: "Endocrinology

## 2019-11-09 NOTE — Telephone Encounter (Signed)
  Who's calling (name and relationship to patient) : [name] ( mom)  Best contact number: [phone]  Provider they see: Dr. Fransico Michael  Reason for call: Mom was contacted by [facility] regarding the patients Synthroid prescription. Medicaid is requiring a fax instead of an EScribe.      PRESCRIPTION REFILL ONLY  Name of prescription: Synthroid   [facility] [address] [place]

## 2019-11-10 ENCOUNTER — Other Ambulatory Visit (INDEPENDENT_AMBULATORY_CARE_PROVIDER_SITE_OTHER): Payer: Self-pay | Admitting: Family

## 2019-11-10 ENCOUNTER — Other Ambulatory Visit (INDEPENDENT_AMBULATORY_CARE_PROVIDER_SITE_OTHER): Payer: Self-pay

## 2019-11-10 MED ORDER — SYNTHROID 100 MCG PO TABS: 100.0000 ug | ORAL_TABLET | Freq: Every day | ORAL | 0 refills | Status: DC

## 2019-11-10 NOTE — Telephone Encounter (Signed)
1 month refill sent.

## 2019-11-10 NOTE — Telephone Encounter (Signed)
Spoke with mom. Let her know that Dr Fransico Michael is out of the office until Monday. She said that the patient has 2 doses left. I let her know that I would ask the on call dr if they could print it and I would fax it in. Synthroid has to be faxed now because of the changes in Medicaid.

## 2019-11-10 NOTE — Telephone Encounter (Signed)
Can you print the prescription for Synthroid or wait until Dr Fransico Michael comes back on Monday. The patient has 2 doses left and because of the changes in Medicaid the prescription has to be faxed.

## 2019-11-10 NOTE — Telephone Encounter (Signed)
Did you print this Rx? It can't be escribed.

## 2019-11-10 NOTE — Telephone Encounter (Signed)
No I sent it. If you want you can print it and I will sign it

## 2020-01-25 ENCOUNTER — Other Ambulatory Visit: Payer: Self-pay

## 2020-01-25 ENCOUNTER — Ambulatory Visit (INDEPENDENT_AMBULATORY_CARE_PROVIDER_SITE_OTHER): Payer: Self-pay | Admitting: Pediatrics

## 2020-01-25 ENCOUNTER — Encounter: Payer: Medicaid Other | Admitting: Pediatrics

## 2020-01-25 ENCOUNTER — Encounter: Payer: Self-pay | Admitting: Pediatrics

## 2020-01-25 ENCOUNTER — Ambulatory Visit (INDEPENDENT_AMBULATORY_CARE_PROVIDER_SITE_OTHER): Payer: Medicaid Other | Admitting: "Endocrinology

## 2020-01-25 VITALS — BP 124/80 | Ht 62.5 in | Wt 135.1 lb

## 2020-01-25 DIAGNOSIS — F902 Attention-deficit hyperactivity disorder, combined type: Secondary | ICD-10-CM

## 2020-01-25 MED ORDER — AMPHETAMINE-DEXTROAMPHET ER 20 MG PO CP24: 20.0000 mg | ORAL_CAPSULE | Freq: Every day | ORAL | 0 refills | Status: DC

## 2020-01-25 NOTE — Progress Notes (Signed)
ADHD meds refilled after normal weight and Blood pressure. Doing well on present dose. See again in 3 months  

## 2020-01-25 NOTE — Patient Instructions (Signed)

## 2020-01-27 ENCOUNTER — Other Ambulatory Visit: Payer: Self-pay

## 2020-01-27 ENCOUNTER — Ambulatory Visit (INDEPENDENT_AMBULATORY_CARE_PROVIDER_SITE_OTHER): Payer: Medicaid Other

## 2020-01-27 DIAGNOSIS — Z23 Encounter for immunization: Secondary | ICD-10-CM

## 2020-02-24 ENCOUNTER — Ambulatory Visit (INDEPENDENT_AMBULATORY_CARE_PROVIDER_SITE_OTHER): Payer: Medicaid Other | Admitting: "Endocrinology

## 2020-03-12 ENCOUNTER — Encounter (INDEPENDENT_AMBULATORY_CARE_PROVIDER_SITE_OTHER): Payer: Self-pay | Admitting: "Endocrinology

## 2020-04-18 ENCOUNTER — Other Ambulatory Visit: Payer: Self-pay

## 2020-04-18 ENCOUNTER — Ambulatory Visit (INDEPENDENT_AMBULATORY_CARE_PROVIDER_SITE_OTHER): Payer: Self-pay | Admitting: Pediatrics

## 2020-04-18 VITALS — BP 110/78 | Ht 63.5 in | Wt 143.8 lb

## 2020-04-18 DIAGNOSIS — F902 Attention-deficit hyperactivity disorder, combined type: Secondary | ICD-10-CM

## 2020-04-18 MED ORDER — AMPHETAMINE-DEXTROAMPHET ER 20 MG PO CP24: 20.0000 mg | ORAL_CAPSULE | Freq: Every day | ORAL | 0 refills | Status: DC

## 2020-04-21 ENCOUNTER — Encounter: Payer: Self-pay | Admitting: Pediatrics

## 2020-04-21 NOTE — Progress Notes (Signed)
ADHD meds refilled after normal weight and Blood pressure. Doing well on present dose. See again in 3 months  

## 2020-06-01 ENCOUNTER — Other Ambulatory Visit (INDEPENDENT_AMBULATORY_CARE_PROVIDER_SITE_OTHER): Payer: Self-pay | Admitting: "Endocrinology

## 2020-06-01 LAB — T3, FREE: T3, Free: 3.6 pg/mL (ref 2.3–4.2)

## 2020-06-01 LAB — T4, FREE: Free T4: 1.5 ng/dL (ref 0.8–1.8)

## 2020-06-01 LAB — TSH: TSH: 0.22 mIU/L — ABNORMAL LOW (ref 0.40–4.50)

## 2020-06-15 ENCOUNTER — Telehealth (INDEPENDENT_AMBULATORY_CARE_PROVIDER_SITE_OTHER): Payer: Self-pay

## 2020-06-15 DIAGNOSIS — E063 Autoimmune thyroiditis: Secondary | ICD-10-CM

## 2020-06-15 NOTE — Progress Notes (Signed)
Called and spoke to mom and she has been informed of the lab results and Dr. Juluis Mire recommendation. Mom expressed understanding.

## 2020-06-15 NOTE — Telephone Encounter (Signed)
-----   Message from David Stall, MD sent at 06/13/2020 11:40 PM EDT ----- Thyroid tests are elevated. Please reduce the Synthroid dose to 100 mcg/day for 6 days each week.  Clinical staff: Please order repeat TSH, free T4, and free T3 to be done in 2 months. Thanks. Dr. Fransico Michael

## 2020-06-15 NOTE — Telephone Encounter (Signed)
Called and spoke to mom and she has been informed of the lab results and Dr. Juluis Mire recommendation. Mom expressed understanding.   Labs have been ordered.

## 2020-06-24 NOTE — Progress Notes (Signed)
Subjective:  Patient Name: Tony Copeland Date of Birth: 2000/01/18  MRN: [id number]  Tony Copeland  presents at his clinic visit today for follow-up of his acquired primary hypothyroidism, thyroiditis, goiter, and being overweight in the setting of Down's syndrome.  HISTORY OF PRESENT ILLNESS:   [name] is a 20 y.o. Caucasian young man.  [name] was accompanied by his mother.  1. [name] was first referred to me on 10/23/2004 by his PCP, Dr. Caroll Rancher, of [facility], at age 83 for evaluation and management of hypothyroidism in the setting of Down's Syndrome.   A. He had had a several year history of having TSH values in the 4.0-5.0 range. Because the child was an active little boy and because these values were "within normal" according to the lab reference values, no actions were taken. In retrospect, the child tended to be cold frequently. He also had problems with constipation. His past medical history was positive for ADHD and for intussusception repair. Family history was positive for the mother having a goiter and the maternal grandmother taking thyroid medication. Labs on 10/18/04 showed a TSH of 7.328 and a free T4 1.19. He was started on Synthroid, 25 mcg per day at that time.   B. After two months on Synthroid therapy he was brighter, more active, and more interactive. We have continued to treat him with Synthroid ever since, with gradual increases in doses as indicated by serial TFT measurements. Our goal has been to maintain his TSH in the "ideal" range of 1.0-2.0, which is the physiologically normal TSH range for 2/3ds of the normal population.  2. [facility] was on 01/19/19. At that visit I continued his Synthroid dose of 100 mcg/day. However, after seeing his lab results from May 2022, I decreased his Synthroid dose to 100 mcg/day for 6 days each week.   A. In the interim, he has been healthy. He has had both covid  vaccinations.  B. His scalp flaking has gradually improved.  B. He is still being treated for ADHD with Adderall XR.  C. He has been more active in the past several months. 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   3. Pertinent Review of Systems:  Constitutional: [name] feels "fine". Mother says that he has been doing well overall. He remains very bright and sharp mentally. He is very social. Eyes: Vision is good overall. He only needs his glasses for reading. There are no other significant eye complaints. His last eye exam occurred on 04/02/18. Family has not scheduled a follow up exam yet.  Neck: [name] has no complaints of anterior neck swelling, soreness, tenderness,  pressure, discomfort, or difficulty swallowing.  Heart: He had an episode of palpitations after exercise in October 2017. EKG and ECHO were normal. The palpitations subsequently resolved and have not recurred. Heart rate increases with exercise or other physical activity. AJ has no complaints of chest pain, or chest pressure.  Gastrointestinal: Appetite is good. He is sensitive to dairy products. He does not have any constipation as long as he takes docusate three times per week. He does not have any other complaints of excessive hunger, acid reflux, upset stomach, stomach aches or pains, or diarrhea. Hands: No problems Legs: Muscle mass and strength seem normal. There are no complaints of numbness, tingling, burning, or pain. No edema is noted. Feet: There are no obvious foot problems. There are no complaints of numbness, tingling, burning, or pain.  No edema is noted. Neurological: His coordination and strength remain good.   PAST MEDICAL, FAMILY, AND SOCIAL HISTORY:  Past Medical History:  Diagnosis Date   ADHD (attention deficit hyperactivity disorder)    Constipation - functional 09/29/2011   Miralax prn   Delayed linear growth    Down syndrome    Down's syndrome    Goiter    Hypothyroidism, acquired,  autoimmune    Intussusception of small intestine (HCC) 03/1999   meckel's leading point   Isosexual precocity    Thyroid disease    Phreesia 06/20/2019   Thyroiditis, autoimmune     Family History  Problem Relation Age of Onset   Thyroid disease Mother    Thyroid disease Maternal Grandmother    Hypertension Maternal Grandmother    Cancer Maternal Grandfather        bone   Heart disease Maternal Grandfather    Cancer Paternal Grandmother        breast   Diabetes Neg Hx      Current Outpatient Medications:    amphetamine-dextroamphetamine (ADDERALL XR) 20 MG 24 hr capsule, Take 1 capsule (20 mg total) by mouth daily with breakfast., Disp: 31 capsule, Rfl: 0   SYNTHROID 100 MCG tablet, TAKE 1 TABLET BY MOUTH EVERY DAY, Disp: 30 tablet, Rfl: 5   amphetamine-dextroamphetamine (ADDERALL XR) 20 MG 24 hr capsule, Take 1 capsule (20 mg total) by mouth daily with breakfast., Disp: 31 capsule, Rfl: 0   amphetamine-dextroamphetamine (ADDERALL XR) 20 MG 24 hr capsule, Take 1 capsule (20 mg total) by mouth daily with breakfast., Disp: 31 capsule, Rfl: 0   ketoconazole (NIZORAL) 2 % shampoo, Apply 1 application topically 2 (two) times a week. (Patient not taking: Reported on 06/25/2020), Disp: 120 mL, Rfl: 3  Allergies as of 06/25/2020   (No Known Allergies)   1. School: He graduated from the 12th grade at [employer] in June 2022. He also graduated from the [employer]nel [employer] for [employer] training. The [facility] staff is still working with him for job placement.    2. Activities:  He will play soccer, then basketball when special Olympics is more active.  He now works at a coffee shop one day a week.  3. Primary Care Provider: Georgiann Hahnamgoolam, Andres, MD  4. He is now covered by Medicaid.   REVIEW OF SYSTEMS: There are no other significant problems involving [name]'s other body systems.   Objective:   BP 118/78 (BP Location: Right Arm, Patient Position: Sitting, Cuff Size: Normal)   Pulse 72   Wt 144  lb 3.2 oz (65.4 kg)   BMI 25.14 kg/m   Wt Readings from Last 3 Encounters:  06/25/20 144 lb 3.2 oz (65.4 kg)  04/18/20 143 lb 12.8 oz (65.2 kg)  01/25/20 135 lb 2 oz (61.3 kg)    Ht Readings from Last 3 Encounters:  04/18/20 5' 3.5" (1.613 m)  01/25/20 5' 2.5" (1.588 m)  10/05/19 5' 2.5" (1.588 m)   Body mass index is 25.14 kg/m. Facility age limit for growth percentiles is 20 years.  Body surface area is 1.71 meters squared.  Constitutional: [name] looks healthy and appears physically and emotionally well. He has re-gained 6.4 ounces since his last visit. He is alert and right. He engaged will with his mother and with me. He is very bright and sharp for a young man with Down's syndrome. His affect is more animated today. When his mother tried to speak for him, he corrected her. His insight is fairly  good.  Face: He has a typical Downs facies, with some asymmetrical rotation of the face and jaw.  Eyes: The eyes are typical for Down's syndrome. There is no arcus or proptosis.  Mouth: The oropharynx appears normal. The tongue appears normal. There is no obvious gingivitis. He has a narrow jaw with some dental crowding. There is normal oral moisture.  Neck: There are no bruits present. The thyroid gland appears normal in size. The thyroid gland is enlarged, again about 21 grams in size. The consistency of the thyroid gland is normal. There is no thyroid tenderness to palpation. Lungs: The lungs are clear. Air movement is good. Heart: The heart rhythm and rate appear normal. Heart sounds S1 and S2 are normal. I do not appreciate any pathologic heart murmurs. Abdomen: The abdominal size is normal. Bowel sounds are normal. The abdomen is soft and non-tender. There is no obviously palpable hepatomegaly, splenomegaly, or other masses.  Arms: Muscle mass appears appropriate for age.  Hands: There is no obvious tremor. Phalangeal and metacarpophalangeal joints appear normal. Palms are normal. Legs:  Muscle mass appears appropriate for age. There is no edema.  Neurologic: Muscle strength is normal 5+ in both the upper and the lower extremities. Muscle tone appears normal. Sensation to touch is normal in the legs.  LAB DATA:  Results for orders placed or performed in visit on 07/20/19  Comprehensive metabolic panel  Result Value Ref Range   Glucose, Bld 80 65 - 99 mg/dL   BUN 13 7 - 25 mg/dL   Creat 3.38 (H) 2.50 - 1.35 mg/dL   BUN/Creatinine Ratio 9 6 - 22 (calc)   Sodium 139 135 - 146 mmol/L   Potassium 4.3 3.5 - 5.3 mmol/L   Chloride 101 98 - 110 mmol/L   CO2 30 20 - 32 mmol/L   Calcium 9.3 8.6 - 10.3 mg/dL   Total Protein 6.2 6.1 - 8.1 g/dL   Albumin 4.2 3.6 - 5.1 g/dL   Globulin 2.0 1.9 - 3.7 g/dL (calc)   AG Ratio 2.1 1.0 - 2.5 (calc)   Total Bilirubin 0.8 0.2 - 1.2 mg/dL   Alkaline phosphatase (APISO) 76 36 - 130 U/L   AST 20 10 - 40 U/L   ALT 11 9 - 46 U/L  Lipid panel  Result Value Ref Range   Cholesterol 161 <200 mg/dL   HDL 40 > OR = 40 mg/dL   Triglycerides 88 <539 mg/dL   LDL Cholesterol (Calc) 103 (H) mg/dL (calc)   Total CHOL/HDL Ratio 4.0 <5.0 (calc)   Non-HDL Cholesterol (Calc) 121 <130 mg/dL (calc)  CBC with Differential/Platelet  Result Value Ref Range   WBC 5.3 3.8 - 10.8 Thousand/uL   RBC 5.19 4.20 - 5.80 Million/uL   Hemoglobin 16.8 13.2 - 17.1 g/dL   HCT 76.7 (H) 34.1 - 93.7 %   MCV 99.0 80.0 - 100.0 fL   MCH 32.4 27.0 - 33.0 pg   MCHC 32.7 32.0 - 36.0 g/dL   RDW 90.2 40.9 - 73.5 %   Platelets 279 140 - 400 Thousand/uL   MPV 9.7 7.5 - 12.5 fL   Neutro Abs 2,608 1,500 - 7,800 cells/uL   Lymphs Abs 2,025 850 - 3,900 cells/uL   Absolute Monocytes 456 200 - 950 cells/uL   Eosinophils Absolute 111 15 - 500 cells/uL   Basophils Absolute 101 0 - 200 cells/uL   Neutrophils Relative % 49.2 %   Total Lymphocyte 38.2 %   Monocytes Relative 8.6 %  Eosinophils Relative 2.1 %   Basophils Relative 1.9 %  T3, free  Result Value Ref Range   T3, Free  3.6 2.3 - 4.2 pg/mL  T4, free  Result Value Ref Range   Free T4 1.5 0.8 - 1.8 ng/dL  TSH  Result Value Ref Range   TSH 0.22 (L) 0.40 - 4.50 mIU/L   Labs 06/01/20: TSH 0.22, free T4 1.5, free T3 3.6  Labs 07/29/19: CMP normal, except creatinine 1.39; CBC normal, except Hct 51.4 (38.5-50.0); cholesterol 161, triglycerides 88, HDL 40, LDL 103  Labs 07/20/19: TSH 1.25, free T4 1.5, free T3 3.3;   Labs 07/15/19: non-fasting labs: HbA1c 4.8%; TSH 1.25, free T4 1.5, free T3 3.3; CMP normal, except creatinine 1.43 (ref 0.60-1.35); CBC normal, except Hct 50.2 (ref 38-50) and MCH 33.1 (ref 27-33); cholesterol 165, triglycerides 116, HDL 37, LDL 107; 25-OH vitamin D 38  01/05/19: TSH 0.86, free T4 1.3, free T3 3.4  07/14/18: TSH 1.07, free T4 1.3, free T3 3.1  03/24/18: TSH 1.42, free T4 1.0, free T3 3.1  09/24/17: TSH 1.82, free T4 1.3, free T3 3.2  08/13/16: TSH 2.68, free T4 1.38, free T3 2.7  10/19/15: TSH 3.04, free T4 1.3, free T3 3.04; normal CBC  06/13/15: HbA1c 5.0%; TSH 2.34, free T4 1.3, free T3 3.2  11/24/14: HbA1c 5.0%; TSH 1.899, free T4 1.20, free T3 3.1; C-peptide 3.11 (ref 0.80-3.90)  05/24/14: TSH 2.338, free T4 1.05, free T3 3.3; C-peptide 2.92 (ref 0.80-3.90)  11/14/13: TSH 1.946, free T4 1.21, free T3 3.5   05/18/13: TSH 1.541, free T4 1.27, free T3 3.2  11/09/12: TSH 1.454, free T4 1.39, free T3 3.4  04/30/12: TSH 1.407, free T4 1.28, free T3 3.5  11/03/11: TSH 1.366, free T4 1.41, free T3 3.6   Assessment and Plan:   ASSESSMENT:  1. Hypothyroid, secondary to Hashimoto's disease:   A. We have adjusted AJ's Synthroid dosage over time to achieve a TSH in the goal range of 1.0-2.0.   B. His TFTs in July 2021 were quite good. However, his TFTS in May 2022 were a bit elevated, so I reduced his Synthroid dose by 16%. He is clinically euthyroid today.  C. His current Synthroid dose is clinically acceptable for him now. However, if his TSH changes further, we may need to  adjust his Synthroid dosage.    D.  We will repeat his TFTs in 3 months and 6 months.   2-3. Goiter/thyroiditis:   A. His thyroid gland is unchanged in size since his last visit.    B. He has not been complaining of any symptoms. His thyroiditis is clinically quiescent today. 4. Down's syndrome: [name] is blessed to have a good family that provides him emotional support and ensures that he is as active athletically and socially as he can be.  He missed participating in S.O. activities and is looking forward to participating again this Fall. Family works hard to keep him busy.   5. Hypercholesterolemia: He has a lipid panel done on 07/15/19, but he was not fasting at the time. His repeat study was essentially unchanged.  6. Overweight: [name] has re-gained a few ounces. He is much more muscular and has much less body fat than the average young adult with Down's syndrome. He has also been more physically active in the past 6 months.   6-8. Elevated creatinine/ hematocrit, and MCH: Given the elevation of all 3 parameters, it is quite possible that AJ was dehydrated when  these labs were obtained. It would be reasonable to repeat his fasting lipid panel, CMP, and CBC on a Monday after having a relaxing weekend, after his TFTs have normalized.. Given the fact that AJ often does not drink enough, it is reasonable to try this option.   PLAN:  1. Diagnostic: We reviewed his lab results from May 2022. Repeat TFTs in mid-August.    2. Therapeutic: Continue the Synthroid dose of 100 mcg/day.   3. Patient education: We discussed the likelihood that his Hashimoto's thyroiditis will eventually destroy more and more thyrocytes, resulting in larger doses of Synthroid over time. Also discussed the importance of healthy diet and increasing his  level of physical activity.  4. Follow-up: 3 months  Level of Service: This visit lasted 55 minutes.   Molli Knock, MD, CDE [facility]

## 2020-06-25 ENCOUNTER — Ambulatory Visit: Payer: Medicaid Other | Admitting: Pediatrics

## 2020-06-25 ENCOUNTER — Other Ambulatory Visit: Payer: Self-pay

## 2020-06-25 ENCOUNTER — Ambulatory Visit (INDEPENDENT_AMBULATORY_CARE_PROVIDER_SITE_OTHER): Payer: Medicaid Other | Admitting: "Endocrinology

## 2020-06-25 ENCOUNTER — Encounter (INDEPENDENT_AMBULATORY_CARE_PROVIDER_SITE_OTHER): Payer: Self-pay | Admitting: "Endocrinology

## 2020-06-25 VITALS — BP 118/78 | HR 72 | Wt 144.2 lb

## 2020-06-25 DIAGNOSIS — E063 Autoimmune thyroiditis: Secondary | ICD-10-CM

## 2020-06-25 DIAGNOSIS — E049 Nontoxic goiter, unspecified: Secondary | ICD-10-CM

## 2020-06-25 DIAGNOSIS — E663 Overweight: Secondary | ICD-10-CM

## 2020-06-25 DIAGNOSIS — E7800 Pure hypercholesterolemia, unspecified: Secondary | ICD-10-CM | POA: Diagnosis not present

## 2020-06-25 NOTE — Patient Instructions (Signed)
Follow up visit in 3 months. 

## 2020-07-10 ENCOUNTER — Other Ambulatory Visit: Payer: Self-pay

## 2020-07-10 ENCOUNTER — Telehealth: Payer: Self-pay

## 2020-07-10 ENCOUNTER — Ambulatory Visit (INDEPENDENT_AMBULATORY_CARE_PROVIDER_SITE_OTHER): Payer: Medicaid Other | Admitting: Pediatrics

## 2020-07-10 ENCOUNTER — Encounter: Payer: Self-pay | Admitting: Pediatrics

## 2020-07-10 VITALS — BP 110/70 | Ht 62.5 in | Wt 143.5 lb

## 2020-07-10 DIAGNOSIS — Q909 Down syndrome, unspecified: Secondary | ICD-10-CM | POA: Diagnosis not present

## 2020-07-10 DIAGNOSIS — Z0001 Encounter for general adult medical examination with abnormal findings: Secondary | ICD-10-CM

## 2020-07-10 DIAGNOSIS — E063 Autoimmune thyroiditis: Secondary | ICD-10-CM | POA: Diagnosis not present

## 2020-07-10 DIAGNOSIS — F902 Attention-deficit hyperactivity disorder, combined type: Secondary | ICD-10-CM | POA: Diagnosis not present

## 2020-07-10 DIAGNOSIS — Z0000 Encounter for general adult medical examination without abnormal findings: Secondary | ICD-10-CM

## 2020-07-10 MED ORDER — TRIAMCINOLONE ACETONIDE 0.1 % EX CREA: 1.0000 "application " | TOPICAL_CREAM | Freq: Two times a day (BID) | CUTANEOUS | 0 refills | Status: AC

## 2020-07-10 NOTE — Telephone Encounter (Signed)
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

## 2020-07-10 NOTE — Progress Notes (Signed)
Adolescent Well Care Visit Tony Copeland is a 20 y.o. male who is here for well care.    PCP:  Georgiann Hahn, MD   History was provided by the patient and mother.  Confidentiality was discussed with the patient and, if applicable, with caregiver as well.  --h/o of trisomy 11 and hypothyroid.  Followed by endocrine for hypothyroid, currently taking synthroid x6 days weekly.  Returns to endocrine in 2 months.  Takes Adderall xr 20mg  and tolerates well with no significant SE.  Scalp itching and red.   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   Nutrition: Nutrition/Eating Behaviors: good eater, 3 meals/day plus snacks, all food groups, mainly drinks water, oatmilk, gatorade Adequate calcium in diet?: adequate Supplements/ Vitamins: multivit  Exercise/ Media: Play any Sports?/ Exercise: walks, does special olympics Screen Time:  < 2 hours Media Rules or Monitoring?: no  Sleep:  Sleep: 8-10hrs  Social Screening: Lives with:  mom, dad Parental relations:  good Activities, Work, and ?: yes Concerns regarding behavior with peers?  no Stressors of note: no  Education: School Name: recently graduated, working at [employer].    Menstruation:   No LMP for male patient. Menstrual History: male   Confidential Social History: Tobacco?  no Secondhand smoke exposure?  no Drugs/ETOH?  no    Safe at home, in school & in relationships?  Yes Safe to self?  Yes   Screenings: Patient has a dental home: yes, has dentist, brush bid   eating habits, exercise habits, and mental health.  Issues were addressed and counseling provided.  Additional topics were addressed as anticipatory guidance.   Physical Exam:  Vitals:   07/10/20 1103  BP: 110/70  Weight: 143 lb 8 oz (65.1 kg)  Height: 5' 2.5" (1.588 m)   BP 110/70   Ht 5' 2.5" (1.588 m)   Wt 143 lb 8 oz (65.1 kg)   BMI 25.83 kg/m  Body mass index:  body mass index is 25.83 kg/m. Growth percentile SmartLinks can only be used for patients less than 89 years old.  Hearing Screening   500Hz  1000Hz  2000Hz  3000Hz  4000Hz   Right ear 20 20 20 20 20   Left ear 20 20 20 20 20    Vision Screening   Right eye Left eye Both eyes  Without correction 10/12.5 10/16   With correction       General Appearance:   alert, oriented, no acute distress, well nourished, and downs facies  appearance, engages well  HENT: Normocephalic, no obvious abnormality, conjunctiva clear  Mouth:   Normal appearing teeth, no obvious discoloration, dental caries, or dental caps  Neck:   Supple; thyroid: no enlargement, symmetric, no tenderness/mass/nodules     Lungs:   Clear to auscultation bilaterally, normal work of breathing  Heart:   Regular rate and rhythm, S1 and S2 normal, no murmurs;   Abdomen:   Soft, non-tender, no mass, or organomegaly  GU genitalia not examined  Musculoskeletal:   Tone and strength strong and symmetrical, all extremities               Lymphatic:   No cervical adenopathy  Skin/Hair/Nails:   Skin warm, dry and intact, no rashes, no bruises or petechiae seborrheic dermatitis vs psoriasis posterior scalp  Neurologic:   Strength, gait, and coordination normal and age-appropriate     Assessment and Plan:   1. Annual physical exam   2. Trisomy 21 syndrome   3.  Hypothyroidism, acquired, autoimmune   4. Attention deficit hyperactivity disorder (ADHD), combined type    --trial coal tar shampoo or salicylic acid shampoo to scalp.  Topical steriod to area for itching bid for a few weeks.  Refer to dermatology if no improvement.  --ADHD meds refilled after normal weight and Blood pressure. Doing well on present dose with no significant side effects reported. See again in 3 months. --hypothyroid is managed by Endocrine, returns in 2 months.   Meds ordered this encounter  Medications   triamcinolone cream (KENALOG) 0.1 %    Sig: Apply 1  application topically 2 (two) times daily. For 2-3 weeks    Dispense:  30 g    Refill:  0   amphetamine-dextroamphetamine (ADDERALL XR) 20 MG 24 hr capsule    Sig: Take 1 capsule (20 mg total) by mouth daily with breakfast.    Dispense:  31 capsule    Refill:  0   amphetamine-dextroamphetamine (ADDERALL XR) 20 MG 24 hr capsule    Sig: Take 1 capsule (20 mg total) by mouth daily with breakfast.    Dispense:  31 capsule    Refill:  0    Please do not fill till 08/14/20   amphetamine-dextroamphetamine (ADDERALL XR) 20 MG 24 hr capsule    Sig: Take 1 capsule (20 mg total) by mouth daily with breakfast.    Dispense:  31 capsule    Refill:  0    Please do not fill till 09/14/20     Hearing screening result:normal Vision screening result: normal   No orders of the defined types were placed in this encounter.    Return in about 1 year (around 07/10/2021).Marland Kitchen  Myles Gip, DO

## 2020-07-14 ENCOUNTER — Encounter: Payer: Self-pay | Admitting: Pediatrics

## 2020-07-14 MED ORDER — AMPHETAMINE-DEXTROAMPHET ER 20 MG PO CP24: 20.0000 mg | ORAL_CAPSULE | Freq: Every day | ORAL | 0 refills | Status: DC

## 2020-07-14 NOTE — Patient Instructions (Signed)
Well Child Nutrition, Young Adult This sheet provides general nutrition recommendations. Talk with a health care provider or a diet and nutrition specialist (dietitian) if you have anyquestions. Nutrition The amount of food you need to eat every day depends on your age, sex, size, and activity level. To figure out your daily calorie needs, look for a caloriecalculator online or talk with your health care provider. Balanced diet Eat a balanced diet. Try to include: Fruits. Aim for 2 cups a day. Examples of 1 cup of fruit include 1 large banana, 1 small apple, 8 large strawberries, or 1 large orange. Eat a variety of whole fruits and 100% fruit juice. Choose fresh, canned, frozen, or dried forms. Choose canned fruit that has the lowest added sugar or no added sugar. Vegetables. Aim for 2-3 cups a day. Examples of 1 cup of vegetables include 2 medium carrots, 1 large tomato, or 2 stalks of celery. Choose fresh, frozen, canned, and dried options. Eat vegetables of a variety of colors. Low-fat dairy. Aim for 3 cups a day. Examples of 1 cup of dairy include 8 oz (230 mL) of milk, 8 oz (230 g) of yogurt, or 1 oz (44 g) of natural cheese. Choose fat-free or low-fat dairy products, including milk, yogurt, and cheese. If you are unable to tolerate dairy (lactose intolerant) or you choose not to consume dairy, you may include fortified soy beverages (soy milk). Whole grains. Of the grain foods that you eat each day (such as pasta, rice, and tortillas), aim to include 6-8 "ounce-equivalents" of whole-grain options. Examples of 1 ounce-equivalent of whole grains include 1 cup of whole-wheat cereal,  cup of brown rice, or 1 slice of whole-wheat bread. Try to choose whole grains including brown rice, wild rice, quinoa, and oats. Lean proteins. Aim for 5-6 "ounce-equivalents" a day. Eat a variety of protein foods, including lean meats, seafood, poultry, eggs, legumes (beans and peas), nuts, seeds, and soy  products. A cut of meat or fish that is the size of a deck of cards is about 3-4 ounce-equivalents. Foods that provide 1 ounce-equivalent of protein include 1 egg,  cup of nuts or seeds, or 1 tablespoon (16 g) of peanut butter. For more information and options for foods in a balanced diet, visit www.DisposableNylon.be Tips for healthy snacking A snack should not be the size of a full meal. Eat snacks that have 200 calories or less. Examples include:  whole-wheat pita with  cup hummus. 2 or 3 slices of deli [place] wrapped around a cheese stick.  apple with 1 tablespoon of peanut butter. 10 baked chips with salsa. Keep cut-up fruits and vegetables available at home and at school so they are easy to eat. Pack healthy snacks the night before or when you pack your lunch. Avoid pre-packaged foods. These tend to be higher in fat, sugar, and salt (sodium). Get involved with shopping, or ask the primary food shopper in your household to get healthy snacks that you like. Avoid chips, candy, cake, and soft drinks. Foods to avoid [place] or heavily processed foods, such as toaster pastries and microwaveable dinners. Drinks that contain a lot of sugar, such as sports drinks, sodas, and juice. Foods that contain a lot of fat, sodium, or sugar. Food safety Prepare your food safely: Wash your hands after handling raw meats. Keep food preparation surfaces clean by washing them regularly with hot, soapy water. Keep raw meats separate from foods that are ready-to-eat, such as fruits and vegetables. Cook seafood, meat, poultry,  meat, poultry, and eggs to the recommended minimum safe internal temperature. Store foods at safe temperatures. In general: Keep cold foods at 40F (4C) or colder. Keep your freezer at 0F (-18C or 18 degrees below 0C) or colder. Keep hot foods at 140F (60C) or warmer. Foods are no longer safe to eat when they have been at a temperature of 40-140F (4-60C) for more than 2 hours. Physical  activity Try to get 150 minutes of moderate-intensity physical activity each week. Examples include walking briskly or bicycling slower than 10 miles an hour (16 km an hour). Do muscle-strengthening exercises on 2 or more days a week. If you find it difficult to fit regular physical activity into your schedule, try: Taking the stairs instead of the elevator. Parking your car farther from the entrance or at the back of the parking lot. Biking or walking to work or school. If you need to lose weight, you may need to reduce your daily calorie intake and increase your daily amount of physical activity. Check with your health care provider before you start a new diet and exercise plan. General instructions Do not skip meals, especially breakfast. Water is the ideal beverage. Aim to drink six 8-oz glasses of water each day. Avoid fad diets. These may affect your mood and growth. If you choose to consume alcohol: Drink in moderation. This means two drinks a day for men and one drink a day for nonpregnant women. One drink equals 12 oz of beer, 5 oz of wine, or 1 oz of hard liquor. You may drink coffee. It is recommended that you limit coffee intake to three to five 8-oz cups a day (up to 400 mg of caffeine). If you are worried about your body image, talk with your parents, your health care provider, or another trusted adult like a coach or counselor. You may be at risk for developing an eating disorder. Eating disorders can lead to serious medical problems. Food allergies may cause you to have a reaction (such as a rash, diarrhea, or vomiting) after eating or drinking. Talk with your health care provider if you have concerns about food allergies. Summary Eat a balanced diet. Include fruits, vegetables, low-fat dairy, whole grains, and lean proteins. Try to get 150 minutes of moderate-intensity physical activity each week, and do muscle-strengthening exercises on 2 or more days a week. Choose healthy  snacks that are 200 calories or less. Drink plenty of water. Try to drink six 8-oz glasses a day. This information is not intended to replace advice given to you by your health care provider. Make sure you discuss any questions you have with your health care provider. Document Revised: 12/14/2019 Document Reviewed: 12/14/2019 Elsevier Patient Education  2022 Elsevier Inc.  

## 2020-07-16 NOTE — Telephone Encounter (Signed)
Form filled out and given to front desk.  Fax or call parent for pickup.    

## 2020-09-21 ENCOUNTER — Other Ambulatory Visit (INDEPENDENT_AMBULATORY_CARE_PROVIDER_SITE_OTHER): Payer: Self-pay | Admitting: "Endocrinology

## 2020-09-25 ENCOUNTER — Other Ambulatory Visit (INDEPENDENT_AMBULATORY_CARE_PROVIDER_SITE_OTHER): Payer: Self-pay | Admitting: "Endocrinology

## 2020-09-27 ENCOUNTER — Ambulatory Visit (INDEPENDENT_AMBULATORY_CARE_PROVIDER_SITE_OTHER): Payer: Medicaid Other | Admitting: "Endocrinology

## 2020-10-01 ENCOUNTER — Ambulatory Visit (INDEPENDENT_AMBULATORY_CARE_PROVIDER_SITE_OTHER): Payer: Medicaid Other | Admitting: "Endocrinology

## 2020-10-16 NOTE — Progress Notes (Signed)
Subjective:  Patient Name: Tony Copeland Date of Birth: [birth year]  MRN: [id number]  Tony Copeland  presents at his clinic visit today for follow-up of his acquired primary hypothyroidism, thyroiditis, goiter, and being overweight in the setting of Down's syndrome.  HISTORY OF PRESENT ILLNESS:   [name] is a 20 y.o. Caucasian young man.  [name] was accompanied by his mother.  1. [name] was first referred to me on 10/23/2004 by his PCP, Dr. Caroll Rancher, of [facility], at age 31 for evaluation and management of hypothyroidism in the setting of Down's Syndrome.   A. He had had a several year history of having TSH values in the 4.0-5.0 range. Because the child was an active little boy and because these values were "within normal" according to the lab reference values, no actions were taken. In retrospect, the child tended to be cold frequently. He also had problems with constipation. His past medical history was positive for ADHD and for intussusception repair. Family history was positive for the mother having a goiter and the maternal grandmother taking thyroid medication. Labs on 10/18/04 showed a TSH of 7.328 and a free T4 1.19. He was started on Synthroid, 25 mcg per day at that time.   B. After two months on Synthroid therapy he was brighter, more active, and more interactive. We have continued to treat him with Synthroid ever since, with gradual increases in doses as indicated by serial TFT measurements. Our goal has been to maintain his TSH in the "ideal" range of 1.0-2.0, which is the physiologically normal TSH range for 2/3ds of the normal population.  2. The patient's last [facility] visit was on 06/25/20. At that visit I continued his Synthroid dose of 100 mcg/day for 6 days per week, but skipping Sundays.    A. In the interim, he has been healthy.  B. He has had both covid vaccinations and both boosters.  C. He has been complaining of some stomach pains  and diarrhea after eating fried foods. At other times he does not have this problem. Mom has some friends whose kids have down's syndrome who have gluten intolerance. She asks if Tony Copeland might have this problem.  D. His scalp flaking has improved.  E. He is still being treated for ADHD with Adderall XR.  F. He has been more active in the past several months. He is playing football and soccer in [employer]. He will also play baseball and run track next Spring.  Dad takes him to two kick-boxing classes per week.   3. Pertinent Review of Systems:  Constitutional: [name] feels "fine". Mother says that he has been doing well overall. He remains very bright and sharp mentally. He is very social. Eyes: Vision is good overall. He only needs his glasses for reading. There are no other significant eye complaints. His last eye exam occurred in about August-September 2022.   Neck: [name] has no complaints of anterior neck swelling, soreness, tenderness,  pressure, discomfort, or difficulty swallowing.  Heart: He had an episode of palpitations after exercise in October 2017. EKG and ECHO were normal. The palpitations subsequently resolved and have not recurred. Heart rate increases with exercise or other physical activity. Tony Copeland has no complaints of chest pain, or chest pressure.  Gastrointestinal: As above. Appetite is good. He is sensitive to dairy products. He does not have any constipation as long as he takes docusate three times per week. He does not have any other complaints of excessive hunger,  acid reflux, or upset stomach. He is taking probiotic gummis.  Hands: No problems Legs: Muscle mass and strength seem normal. There are no complaints of numbness, tingling, burning, or pain. No edema is noted. Feet: There are no obvious foot problems. There are no complaints of numbness, tingling, burning, or pain. No edema is noted. Neurological: His coordination and strength remain good.   PAST MEDICAL, FAMILY, AND  SOCIAL HISTORY:  Past Medical History:  Diagnosis Date   ADHD (attention deficit hyperactivity disorder)    Constipation - functional 09/29/2011   Miralax prn   Delayed linear growth    Down syndrome    Down's syndrome    Goiter    Hypothyroidism, acquired, autoimmune    Intussusception of small intestine (HCC) 03/1999   meckel's leading point   Isosexual precocity    Thyroid disease    Phreesia 06/20/2019   Thyroiditis, autoimmune     Family History  Problem Relation Age of Onset   Thyroid disease Mother    Thyroid disease Maternal Grandmother    Hypertension Maternal Grandmother    Cancer Maternal Grandfather        bone   Heart disease Maternal Grandfather    Cancer Paternal Grandmother        breast   Diabetes Neg Hx      Current Outpatient Medications:    amphetamine-dextroamphetamine (ADDERALL XR) 20 MG 24 hr capsule, Take 1 capsule (20 mg total) by mouth daily with breakfast., Disp: 31 capsule, Rfl: 0   SYNTHROID 100 MCG tablet, TAKE 1 TABLET BY MOUTH EVERY DAY, Disp: 90 tablet, Rfl: 1   ketoconazole (NIZORAL) 2 % shampoo, Apply 1 application topically 2 (two) times a week. (Patient not taking: No sig reported), Disp: 120 mL, Rfl: 3   triamcinolone cream (KENALOG) 0.1 %, Apply 1 application topically 2 (two) times daily. For 2-3 weeks (Patient not taking: Reported on 10/17/2020), Disp: 30 g, Rfl: 0  Allergies as of 10/17/2020   (No Known Allergies)   1. School: He graduated from the 12th grade at [employer] in June 2022.  2. Activities:  As above. He has two part-time jobs:     3. Primary Care Provider: Georgiann Hahn, MD  4. He is now covered by Medicaid.   REVIEW OF SYSTEMS: There are no other significant problems involving [name]'s other body systems.   Objective:   BP 102/60   Pulse 76   Wt 137 lb 3.2 oz (62.2 kg)   BMI 24.69 kg/m   Wt Readings from Last 3 Encounters:  10/17/20 137 lb 3.2 oz (62.2 kg)  07/10/20 143 lb 8 oz (65.1 kg)  06/25/20  144 lb 3.2 oz (65.4 kg)    Ht Readings from Last 3 Encounters:  07/10/20 5' 2.5" (1.588 m)  04/18/20 5' 3.5" (1.613 m)  01/25/20 5' 2.5" (1.588 m)   Body mass index is 24.69 kg/m. Facility age limit for growth percentiles is 20 years.  Body surface area is 1.66 meters squared.  Constitutional: [name] looks healthy and appears physically and emotionally well. He has lost 6 pounds since his last visit. He is alert and right. He engaged will with his mother and with me. He is very bright and sharp for a young man with Down's syndrome. His affect is more animated today. He wanted to tell me all about his watch that measures his heart rate. When his mother tried to speak for him, he corrected her, but politely so. His insight is fairly good.  Face: He has a typical Downs facies, with some asymmetrical rotation of the face and jaw. He now has a grade 3 mustache. He wanted to make sure that I noted the mustache today.  Eyes: The eyes are typical for Down's syndrome. There is no arcus or proptosis.  Mouth: The oropharynx appears normal. The tongue appears normal. There is no obvious gingivitis. He has a narrow jaw with some dental crowding. There is normal oral moisture.  Neck: There are no bruits present. The thyroid gland appears normal in size. The thyroid gland is enlarged again about 21 grams in size. The consistency of the thyroid gland is normal. There is no thyroid tenderness to palpation. Lungs: The lungs are clear. Air movement is good. Heart: The heart rhythm and rate appear normal. Heart sounds S1 and S2 are normal. I do not appreciate any pathologic heart murmurs. Abdomen: The abdominal size is normal. Bowel sounds are normal. The abdomen is soft and non-tender. There is no obviously palpable hepatomegaly, splenomegaly, or other masses.  Arms: Muscle mass appears appropriate for age.  Hands: There is no obvious tremor. Phalangeal and metacarpophalangeal joints appear normal. Palms are  normal. Nail beds are a bit pallid. Legs: Muscle mass appears appropriate for age. There is no edema.  Neurologic: Muscle strength is normal 5+ in both the upper and the lower extremities. Muscle tone appears normal. Sensation to touch is normal in the legs.  LAB DATA:   Labs 10/17/20: TFTs, CMP, CBC, iron, tTG IgA, IGA pending  Labs 06/01/20: TSH 0.22, free T4 1.5, free T3 3.6  Labs 07/29/19: CMP normal, except creatinine 1.39; CBC normal, except Hct 51.4 (38.5-50.0); cholesterol 161, triglycerides 88, HDL 40, LDL 103  Labs 07/20/19: TSH 1.25, free T4 1.5, free T3 3.3;   Labs 07/15/19: non-fasting labs: HbA1c 4.8%; TSH 1.25, free T4 1.5, free T3 3.3; CMP normal, except creatinine 1.43 (ref 0.60-1.35); CBC normal, except Hct 50.2 (ref 38-50) and MCH 33.1 (ref 27-33); cholesterol 165, triglycerides 116, HDL 37, LDL 107; 25-OH vitamin D 38  01/05/19: TSH 0.86, free T4 1.3, free T3 3.4  07/14/18: TSH 1.07, free T4 1.3, free T3 3.1  03/24/18: TSH 1.42, free T4 1.0, free T3 3.1  09/24/17: TSH 1.82, free T4 1.3, free T3 3.2  08/13/16: TSH 2.68, free T4 1.38, free T3 2.7  10/19/15: TSH 3.04, free T4 1.3, free T3 3.04; normal CBC  06/13/15: HbA1c 5.0%; TSH 2.34, free T4 1.3, free T3 3.2  11/24/14: HbA1c 5.0%; TSH 1.899, free T4 1.20, free T3 3.1; C-peptide 3.11 (ref 0.80-3.90)  05/24/14: TSH 2.338, free T4 1.05, free T3 3.3; C-peptide 2.92 (ref 0.80-3.90)  11/14/13: TSH 1.946, free T4 1.21, free T3 3.5   05/18/13: TSH 1.541, free T4 1.27, free T3 3.2  11/09/12: TSH 1.454, free T4 1.39, free T3 3.4  04/30/12: TSH 1.407, free T4 1.28, free T3 3.5  11/03/11: TSH 1.366, free T4 1.41, free T3 3.6   Assessment and Plan:   ASSESSMENT:  1. Hypothyroid, secondary to Hashimoto's disease:   A. We have adjusted Tony Copeland's Synthroid dosage over time to achieve a TSH in the goal range of 1.0-2.0.   B. His TFTs in July 2021 were quite good. However, his TFTs in May 2022 were a bit elevated, so I reduced his  Synthroid dose by 16%. He is clinically euthyroid today.  C. He is clinically euthyroid on his current Synthroid dose today. However, if his TSH changes further, we may need to adjust  his Synthroid dosage.    D.  We will repeat his TFTs today and in 6 months.   2-3. Goiter/thyroiditis:   A. His thyroid gland is unchanged in size since his last visit.    B. He has not been complaining of any symptoms. His thyroiditis is clinically quiescent today. 4. Down's syndrome: [name] is blessed to have a good family that provides him emotional support and ensures that he is as active athletically and socially as he can be.  He really enjoyed resuming S.O. activities and is looking forward to participating again in the coming year. Family works hard to keep him active and involved.   5. Hypercholesterolemia: He has a lipid panel done on 07/15/19, but he was not fasting at the time. His repeat study was essentially unchanged.  6. Overweight: [name] has lost 6 pounds since his last visit, primarily due to being much more physically active.  7-9. Elevated creatinine/ hematocrit, and MCH: Given the elevation of all 3 parameters, it is quite possible that Tony Copeland was dehydrated when these labs were obtained. It would be reasonable to repeat his fasting lipid panel, CMP, and CBC on a Monday after having a relaxing weekend, after his TFTs have normalized.. Given the fact that Tony Copeland often does not drink enough, it is reasonable to try this option.  10: Stomach pains and diarrhea: It is reasonable to screen for celiac disease  11. Nailbed pallor: It is reasonable to screen him for anemia and iron deficiency.  PLAN:  1. Diagnostic: We reviewed his lab results from May 2022. Repeat TFTs, CMP, CBC, iron, tTG IgA, IGA  Repeat labs prior to next visit. .     2. Therapeutic: Continue the Synthroid dose of 100 mcg/day for 6 days per week now. .   3. Patient education: We discussed the likelihood that his Hashimoto's thyroiditis will  eventually destroy more and more thyrocytes, resulting in larger doses of Synthroid over time. Also discussed the importance of healthy diet and increasing his  level of physical activity. We also discussed the possibility of Tony Copeland developing celiac disease.  4. Follow-up: 6 months  Level of Service: This visit lasted 60 minutes.   Molli Knock, MD, CDE [facility]in[facility]

## 2020-10-17 ENCOUNTER — Encounter (INDEPENDENT_AMBULATORY_CARE_PROVIDER_SITE_OTHER): Payer: Self-pay | Admitting: "Endocrinology

## 2020-10-17 ENCOUNTER — Other Ambulatory Visit: Payer: Self-pay

## 2020-10-17 ENCOUNTER — Ambulatory Visit (INDEPENDENT_AMBULATORY_CARE_PROVIDER_SITE_OTHER): Payer: Medicaid Other | Admitting: "Endocrinology

## 2020-10-17 VITALS — BP 102/60 | HR 76 | Wt 137.2 lb

## 2020-10-17 DIAGNOSIS — E049 Nontoxic goiter, unspecified: Secondary | ICD-10-CM | POA: Diagnosis not present

## 2020-10-17 DIAGNOSIS — R197 Diarrhea, unspecified: Secondary | ICD-10-CM

## 2020-10-17 DIAGNOSIS — E063 Autoimmune thyroiditis: Secondary | ICD-10-CM | POA: Diagnosis not present

## 2020-10-17 DIAGNOSIS — R231 Pallor: Secondary | ICD-10-CM

## 2020-10-17 DIAGNOSIS — E663 Overweight: Secondary | ICD-10-CM | POA: Diagnosis not present

## 2020-10-17 DIAGNOSIS — R1084 Generalized abdominal pain: Secondary | ICD-10-CM

## 2020-10-17 DIAGNOSIS — Q909 Down syndrome, unspecified: Secondary | ICD-10-CM

## 2020-10-17 NOTE — Patient Instructions (Signed)
Up visit in 6 months. Please repeat lab tests 1-2 weeks prior if possible.  At [facility] we are committed to providing exceptional care. You will receive a patient satisfaction survey through text or email regarding your visit today. Your opinion is important to me. Comments are appreciated.

## 2020-10-18 LAB — COMPREHENSIVE METABOLIC PANEL
AG Ratio: 1.8 (calc) (ref 1.0–2.5)
ALT: 9 U/L (ref 9–46)
AST: 23 U/L (ref 10–40)
Albumin: 4.1 g/dL (ref 3.6–5.1)
Alkaline phosphatase (APISO): 67 U/L (ref 36–130)
BUN/Creatinine Ratio: 9 (calc) (ref 6–22)
BUN: 13 mg/dL (ref 7–25)
CO2: 30 mmol/L (ref 20–32)
Calcium: 9.2 mg/dL (ref 8.6–10.3)
Chloride: 102 mmol/L (ref 98–110)
Creat: 1.5 mg/dL — ABNORMAL HIGH (ref 0.60–1.24)
Globulin: 2.3 g/dL (calc) (ref 1.9–3.7)
Glucose, Bld: 79 mg/dL (ref 65–139)
Potassium: 4.1 mmol/L (ref 3.5–5.3)
Sodium: 138 mmol/L (ref 135–146)
Total Bilirubin: 0.6 mg/dL (ref 0.2–1.2)
Total Protein: 6.4 g/dL (ref 6.1–8.1)

## 2020-10-18 LAB — CBC WITH DIFFERENTIAL/PLATELET
Absolute Monocytes: 486 cells/uL (ref 200–950)
Basophils Absolute: 77 cells/uL (ref 0–200)
Basophils Relative: 1.2 %
Eosinophils Absolute: 13 cells/uL — ABNORMAL LOW (ref 15–500)
Eosinophils Relative: 0.2 %
HCT: 46.9 % (ref 38.5–50.0)
Hemoglobin: 15.3 g/dL (ref 13.2–17.1)
Lymphs Abs: 2029 cells/uL (ref 850–3900)
MCH: 31.9 pg (ref 27.0–33.0)
MCHC: 32.6 g/dL (ref 32.0–36.0)
MCV: 97.9 fL (ref 80.0–100.0)
MPV: 9.6 fL (ref 7.5–12.5)
Monocytes Relative: 7.6 %
Neutro Abs: 3795 cells/uL (ref 1500–7800)
Neutrophils Relative %: 59.3 %
Platelets: 271 10*3/uL (ref 140–400)
RBC: 4.79 10*6/uL (ref 4.20–5.80)
RDW: 12.8 % (ref 11.0–15.0)
Total Lymphocyte: 31.7 %
WBC: 6.4 10*3/uL (ref 3.8–10.8)

## 2020-10-18 LAB — T4, FREE: Free T4: 1.3 ng/dL (ref 0.8–1.8)

## 2020-10-18 LAB — T3, FREE: T3, Free: 3.6 pg/mL (ref 2.3–4.2)

## 2020-10-18 LAB — TISSUE TRANSGLUTAMINASE, IGA: (tTG) Ab, IgA: <1 U/mL

## 2020-10-18 LAB — IRON: Iron: 63 ug/dL (ref 50–195)

## 2020-10-18 LAB — IGA: Immunoglobulin A: 338 mg/dL — ABNORMAL HIGH (ref 47–310)

## 2020-10-18 LAB — TSH: TSH: 1.13 mIU/L (ref 0.40–4.50)

## 2020-10-24 ENCOUNTER — Telehealth (INDEPENDENT_AMBULATORY_CARE_PROVIDER_SITE_OTHER): Payer: Self-pay | Admitting: "Endocrinology

## 2020-10-24 DIAGNOSIS — R7989 Other specified abnormal findings of blood chemistry: Secondary | ICD-10-CM

## 2020-10-24 NOTE — Telephone Encounter (Signed)
Labwork is back. I will send Dr Fransico Michael a secure chat to have them resulted and call patient.

## 2020-10-24 NOTE — Telephone Encounter (Signed)
Who's calling (name and relationship to patient) : [name] Morten mom  Best contact number: [phone]  Provider they see: Dr. Fransico Michael  Reason for call: Would like to know if lab results came in    Call ID:      PRESCRIPTION REFILL ONLY  Name of prescription:  Pharmacy:

## 2020-10-31 ENCOUNTER — Other Ambulatory Visit (INDEPENDENT_AMBULATORY_CARE_PROVIDER_SITE_OTHER): Payer: Self-pay | Admitting: "Endocrinology

## 2020-10-31 NOTE — Telephone Encounter (Signed)
Spoke with mom. Gave results and she gave the ok to go ahead with the referral.    I let Dr Fransico Michael know that I have put in the referral and faxed to [facility] the referral coordinator.

## 2020-10-31 NOTE — Telephone Encounter (Signed)
Mom called back regarding lab results. Call back number is [phone]

## 2020-11-16 ENCOUNTER — Telehealth (INDEPENDENT_AMBULATORY_CARE_PROVIDER_SITE_OTHER): Payer: Self-pay | Admitting: "Endocrinology

## 2020-11-16 NOTE — Telephone Encounter (Signed)
Spoke with mom. She said that someone sent her an email regarding the referral to Nephrology. I gave her the number to [facility].

## 2020-11-16 NOTE — Telephone Encounter (Signed)
  Who's calling (name and relationship to patient) : Tony Copeland; mom  Best contact number: [phone]  Provider they see: Dr. Fransico Michael  Reason for call: Mom stated that she was referred to a [facility] by Dr. Fransico Michael. She has been contacted by one in [place], but wasn't sure if that was correct due to it being in [place] and not [place]. Mom would like a call back with confirmation.    PRESCRIPTION REFILL ONLY  Name of prescription:  Pharmacy:

## 2020-11-21 ENCOUNTER — Other Ambulatory Visit: Payer: Self-pay

## 2020-11-21 ENCOUNTER — Ambulatory Visit (INDEPENDENT_AMBULATORY_CARE_PROVIDER_SITE_OTHER): Payer: Self-pay | Admitting: Pediatrics

## 2020-11-21 VITALS — BP 124/76 | Ht 62.75 in | Wt 139.9 lb

## 2020-11-21 DIAGNOSIS — F902 Attention-deficit hyperactivity disorder, combined type: Secondary | ICD-10-CM

## 2020-11-21 MED ORDER — AMPHETAMINE-DEXTROAMPHET ER 20 MG PO CP24: 20.0000 mg | ORAL_CAPSULE | Freq: Every day | ORAL | 0 refills | Status: DC

## 2020-11-22 ENCOUNTER — Encounter: Payer: Self-pay | Admitting: Pediatrics

## 2020-11-22 NOTE — Progress Notes (Signed)
ADHD meds refilled after normal weight and Blood pressure. Doing well on present dose. See again in 3 months  

## 2020-12-12 ENCOUNTER — Other Ambulatory Visit: Payer: Self-pay | Admitting: Nephrology

## 2020-12-12 DIAGNOSIS — N1831 Chronic kidney disease, stage 3a: Secondary | ICD-10-CM

## 2020-12-26 ENCOUNTER — Ambulatory Visit
Admission: RE | Admit: 2020-12-26 | Discharge: 2020-12-26 | Disposition: A | Discharge status: Home / Self Care | Payer: Medicaid Other | Source: Ambulatory Visit | Attending: Nephrology | Admitting: Nephrology

## 2020-12-26 DIAGNOSIS — N1831 Chronic kidney disease, stage 3a: Secondary | ICD-10-CM

## 2021-04-04 LAB — T3, FREE: T3, Free: 3.3 pg/mL (ref 2.3–4.2)

## 2021-04-04 LAB — T4, FREE: Free T4: 1.3 ng/dL (ref 0.8–1.8)

## 2021-04-04 LAB — TSH: TSH: 0.43 mIU/L (ref 0.40–4.50)

## 2021-04-16 NOTE — Progress Notes (Signed)
? Subjective:  ?Patient Name: Tony Copeland Date of Birth: 2000/07/22  MRN: [id number] ? ?Tony Copeland  presents at his clinic visit today for follow-up of his acquired primary hypothyroidism, thyroiditis, goiter, and being overweight in the setting of Down's syndrome. ? ?HISTORY OF PRESENT ILLNESS:  ? ?[name] is a 21 y.o. Caucasian young man. ? ?[name] was accompanied by his mother. ? ?1. [name] was first referred to me on 10/23/2004 by his PCP, Dr. Caroll Rancher, of [facility], at age 21 for evaluation and management of hypothyroidism in the setting of Down's Syndrome.  ? A. He had had a several year history of having TSH values in the 4.0-5.0 range. Because the child was an active little boy and because these values were "within normal" according to the lab reference values, no actions were taken. In retrospect, the child tended to be cold frequently. He also had problems with constipation. His past medical history was positive for ADHD and for intussusception repair. Family history was positive for the mother having a goiter and the maternal grandmother taking thyroid medication. Labs on 10/18/04 showed a TSH of 7.328 and a free T4 1.19. He was started on Synthroid, 25 mcg per day at that time.  ? B. After two months on Synthroid therapy he was brighter, more active, and more interactive. We have continued to treat him with Synthroid ever since, with gradual increases in doses as indicated by serial TFT measurements. Our goal has been to maintain his TSH in the "ideal" range of 1.0-2.0, which is the physiologically normal TSH range for 2/3ds of the normal population. ? ?2. The patient's last [facility] visit was on 10/17/20. At that visit I continued his Synthroid dose of 100 mcg/day for 6 days per week, but skipping Sundays.   ? A. In the interim, he has been healthy.  ?B. He has had both covid vaccinations and both boosters. ? C. He occasionally has stomach pains and  diarrhea after eating fried foods. At other times he does not have this problem. Mom has some friends whose kids have down's syndrome who have gluten intolerance.  ?D. He saw a kidney specialists at [facility] in December 2022 for his elevated creatinine. His [place] was c/w chronic medical renal disease. He was told to drink more fluids. He will be followed there.  ?E. His scalp flaking has improved. ? F. He is still being treated for ADHD with Adderall XR. ? G. He has been more active in the past several months. He is involved in track, swimming, and bowling in [employer]. He will also play baseball.  Dad also takes him to two kick-boxing classes per week.  ? ?3. Pertinent Review of Systems:  ?Constitutional: [name] feels "fine". Mother says that he has been doing well overall. He remains very bright and sharp mentally. He is very social. ?Eyes: Vision is good overall. He only needs his glasses for reading. There are no other significant eye complaints. His last eye exam occurred in about August-September 2022.   ?Neck: [name] has no complaints of anterior neck swelling, soreness, tenderness,  pressure, discomfort, or difficulty swallowing.  ?Heart: He had an episode of palpitations after exercise in October 2017. EKG and ECHO were normal. The palpitations subsequently resolved and have not recurred. Heart rate increases with exercise or other physical activity. AJ has no complaints of chest pain, or chest pressure.  ?Gastrointestinal: As above. Appetite is good. He is sensitive to dairy products. He does  not have any constipation as long as he takes docusate three times per week. He does not have any other complaints of excessive hunger, acid reflux, or upset stomach. He is no longer taking probiotic gummis.  ?Hands: No problems ?Legs: Muscle mass and strength seem normal. There are no complaints of numbness, tingling, burning, or pain. No edema is noted. ?Feet: There are no obvious foot problems. There are no  complaints of numbness, tingling, burning, or pain. No edema is noted. ?Neurological: His coordination and strength remain good. ?  ?PAST MEDICAL, FAMILY, AND SOCIAL HISTORY: ? ?Past Medical History:  ?Diagnosis Date  ? ADHD (attention deficit hyperactivity disorder)   ? Constipation - functional 09/29/2011  ? Miralax prn  ? Delayed linear growth   ? Down syndrome   ? Down's syndrome   ? Goiter   ? Hypothyroidism, acquired, autoimmune   ? Intussusception of small intestine (HCC) 03/1999  ? meckel's leading point  ? Isosexual precocity   ? Thyroid disease   ? Phreesia 06/20/2019  ? Thyroiditis, autoimmune   ? ? ?Family History  ?Problem Relation Age of Onset  ? Thyroid disease Mother   ? Thyroid disease Maternal Grandmother   ? Hypertension Maternal Grandmother   ? Cancer Maternal Grandfather   ?     bone  ? Heart disease Maternal Grandfather   ? Cancer Paternal Grandmother   ?     breast  ? Diabetes Neg Hx   ? ? ? ?Current Outpatient Medications:  ?  SYNTHROID 100 MCG tablet, Take 1 tablet (100 mcg total) by mouth daily. Take 1 tablet by mouth 6 days a week, Disp: 30 tablet, Rfl: 21 ?  amphetamine-dextroamphetamine (ADDERALL XR) 20 MG 24 hr capsule, Take 1 capsule (20 mg total) by mouth daily with breakfast., Disp: 31 capsule, Rfl: 0 ?  amphetamine-dextroamphetamine (ADDERALL XR) 20 MG 24 hr capsule, Take 1 capsule (20 mg total) by mouth daily with breakfast., Disp: 31 capsule, Rfl: 0 ?  amphetamine-dextroamphetamine (ADDERALL XR) 20 MG 24 hr capsule, Take 1 capsule (20 mg total) by mouth daily with breakfast., Disp: 31 capsule, Rfl: 0 ?  ketoconazole (NIZORAL) 2 % shampoo, Apply 1 application topically 2 (two) times a week. (Patient not taking: Reported on 06/25/2020), Disp: 120 mL, Rfl: 3 ?  triamcinolone cream (KENALOG) 0.1 %, Apply 1 application topically 2 (two) times daily. For 2-3 weeks (Patient not taking: Reported on 10/17/2020), Disp: 30 g, Rfl: 0 ? ?Allergies as of 04/17/2021  ? (No Known Allergies)  ? ?1.  School: He graduated from the 12th grade at [employer] in June 2022.  ?2. Activities:  As above. He has two part-time jobs:     ?3. Primary Care Provider: Georgiann Hahn, MD AJ is in the process of aging out of that practice. I again recommended Dr. Berniece Andreas.  ?4. AJ is now covered by Medicaid.  ? ?REVIEW OF SYSTEMS: There are no other significant problems involving [name]'s other body systems. ? ? Objective:  ? ?BP 112/78   Pulse 80   Wt 138 lb (62.6 kg)   BMI 23.61 kg/m?  ? ?Wt Readings from Last 3 Encounters:  ?04/17/21 138 lb (62.6 kg)  ?04/17/21 138 lb (62.6 kg)  ?11/21/20 139 lb 14.4 oz (63.5 kg)  ? ? ?Ht Readings from Last 3 Encounters:  ?04/17/21 5' 4.1" (1.628 m)  ?11/21/20 5' 2.75" (1.594 m)  ?07/10/20 5' 2.5" (1.588 m)  ? ?Body mass index is 23.61 kg/m?Marland [facility] Facility age limit  for growth percentiles is 20 years. ? ?Body surface area is 1.68 meters squared. ? ?Constitutional: [name] looks healthy and appears physically and emotionally well. His weight is stable. He is alert and right. He engaged will with his mother and with me. He is very bright and sharp for a young man with Down's syndrome. His affect is normal today. His insight is fairly good.  ?Face: He has a typical Downs facies, with some asymmetrical rotation of the face and jaw. He now has a grade 3 mustache.  ?Eyes: The eyes are typical for Down's syndrome. There is no arcus or proptosis.  ?Mouth: The oropharynx appears normal. The tongue appears normal. There is no obvious gingivitis. He has a narrow jaw with some dental crowding. There is normal oral moisture.  ?Neck: There are no bruits present. The thyroid gland appears normal in size. The thyroid gland is enlarged again about 21 grams in size. The consistency of the thyroid gland is normal. There is no thyroid tenderness to palpation. ?Lungs: The lungs are clear. Air movement is good. ?Heart: The heart rhythm and rate appear normal. Heart sounds S1 and S2 are normal. I do not appreciate  any pathologic heart murmurs. ?Abdomen: The abdominal size is normal. Bowel sounds are normal. The abdomen is soft and non-tender. There is no obviously palpable hepatomegaly, splenomegaly, or other masses.  ?Ar

## 2021-04-17 ENCOUNTER — Encounter (INDEPENDENT_AMBULATORY_CARE_PROVIDER_SITE_OTHER): Payer: Self-pay | Admitting: "Endocrinology

## 2021-04-17 ENCOUNTER — Ambulatory Visit (INDEPENDENT_AMBULATORY_CARE_PROVIDER_SITE_OTHER): Payer: Self-pay | Admitting: Pediatrics

## 2021-04-17 ENCOUNTER — Ambulatory Visit (INDEPENDENT_AMBULATORY_CARE_PROVIDER_SITE_OTHER): Payer: Medicaid Other | Admitting: "Endocrinology

## 2021-04-17 ENCOUNTER — Encounter: Payer: Self-pay | Admitting: Pediatrics

## 2021-04-17 VITALS — BP 110/66 | Ht 64.1 in | Wt 138.0 lb

## 2021-04-17 VITALS — BP 112/78 | HR 80 | Wt 138.0 lb

## 2021-04-17 DIAGNOSIS — E049 Nontoxic goiter, unspecified: Secondary | ICD-10-CM

## 2021-04-17 DIAGNOSIS — R7989 Other specified abnormal findings of blood chemistry: Secondary | ICD-10-CM

## 2021-04-17 DIAGNOSIS — E063 Autoimmune thyroiditis: Secondary | ICD-10-CM

## 2021-04-17 DIAGNOSIS — F902 Attention-deficit hyperactivity disorder, combined type: Secondary | ICD-10-CM

## 2021-04-17 DIAGNOSIS — Q909 Down syndrome, unspecified: Secondary | ICD-10-CM

## 2021-04-17 DIAGNOSIS — R231 Pallor: Secondary | ICD-10-CM

## 2021-04-17 MED ORDER — AMPHETAMINE-DEXTROAMPHET ER 20 MG PO CP24: 20.0000 mg | ORAL_CAPSULE | Freq: Every day | ORAL | 0 refills | Status: DC

## 2021-04-17 NOTE — Progress Notes (Signed)
ADHD meds refilled after normal weight and Blood pressure. Doing well on present dose. See again in 3 months  

## 2021-04-17 NOTE — Patient Instructions (Signed)
Follow up visit in six months. Please repeat lab tests in 3 months and 6 months.  ? ?At [facility], we are committed to providing exceptional care. You will receive a patient satisfaction survey through text or email regarding your visit today. Your opinion is important to me. Comments are appreciated. ? ?

## 2021-07-01 ENCOUNTER — Encounter: Payer: Self-pay | Admitting: Pediatrics

## 2021-07-01 ENCOUNTER — Ambulatory Visit (INDEPENDENT_AMBULATORY_CARE_PROVIDER_SITE_OTHER): Payer: Self-pay | Admitting: Pediatrics

## 2021-07-01 VITALS — BP 112/60 | Ht 62.8 in | Wt 132.1 lb

## 2021-07-01 DIAGNOSIS — F902 Attention-deficit hyperactivity disorder, combined type: Secondary | ICD-10-CM

## 2021-07-01 MED ORDER — AMPHETAMINE-DEXTROAMPHET ER 20 MG PO CP24: 20.0000 mg | ORAL_CAPSULE | Freq: Every day | ORAL | 0 refills | Status: AC

## 2021-07-01 MED ORDER — AMPHETAMINE-DEXTROAMPHET ER 20 MG PO CP24: 20.0000 mg | ORAL_CAPSULE | Freq: Every day | ORAL | 0 refills | Status: DC

## 2021-07-09 LAB — T4, FREE: Free T4: 1.3 ng/dL (ref 0.8–1.8)

## 2021-07-09 LAB — T3, FREE: T3, Free: 3.2 pg/mL (ref 2.3–4.2)

## 2021-07-09 LAB — TSH: TSH: 0.42 mIU/L (ref 0.40–4.50)

## 2021-08-12 ENCOUNTER — Telehealth (INDEPENDENT_AMBULATORY_CARE_PROVIDER_SITE_OTHER): Payer: Self-pay | Admitting: "Endocrinology

## 2021-08-12 NOTE — Telephone Encounter (Signed)
  Name of who is calling: Jillene Bucks Relationship to Patient: Mom  Best contact number: [phone]  Provider they see: Dr. Ross Marcus  Reason for call: Mom called wanting to know if Jayson needed any blood work before appt August 14.      PRESCRIPTION REFILL ONLY  Name of prescription:  Pharmacy:

## 2021-08-12 NOTE — Telephone Encounter (Signed)
Patient does not need labs. He just had them in July. Per Dr Audie Clear in April. Patient to have labs in 3 months then again in 6 months.

## 2021-08-18 NOTE — Progress Notes (Signed)
Subjective:  Patient Name: Tony Copeland Date of Birth: [birth year]  MRN: [id number]  Tony Copeland  presents at his clinic visit today for follow-up of his acquired primary hypothyroidism, thyroiditis, goiter, and being overweight in the setting of Down's syndrome.  HISTORY OF PRESENT ILLNESS:   Tony Antu. is a 21 y.o. Caucasian young man.  [name] was accompanied by his mother.  1. [name] was first referred to me on 10/23/2004 by his PCP, Dr. Caroll Rancher, of [facility], at age 39 for evaluation and management of hypothyroidism in the setting of Down's Syndrome.   A. He had had a several year history of having TSH values in the 4.0-5.0 range. Because the child was an active little boy and because these values were "within normal" according to the lab reference values, no actions were taken. In retrospect, the child tended to be cold frequently. He also had problems with constipation. His past medical history was positive for ADHD and for intussusception repair. Family history was positive for the mother having a goiter and the maternal grandmother taking thyroid medication. Labs on 10/18/04 showed a TSH of 7.328 and a free T4 1.19. He was started on Synthroid, 25 mcg per day at that time.   B. After two months on Synthroid therapy he was brighter, more active, and more interactive. We have continued to treat him with Synthroid ever since, with gradual increases in doses as indicated by serial TFT measurements. Our goal has been to maintain his TSH in the "ideal" range of 1.0-2.0, which is the physiologically normal TSH range for 2/3ds of the normal population.  2. Clinical course:   A. During the past 17 years we have gradually increased Tony Copeland's Synthroid dosage to try to maintain a TSH in the goal range of 1.0-2.0. B. He had an episode of palpitations after exercise in October 2017. EKG and ECHO were normal. The palpitations subsequently resolved and have not recurred.  C. He occasionally has  stomach pains and diarrhea after eating fried foods. At other times he does not have this problem. Mom has some friends whose kids have Down's Syndrome who have gluten intolerance.   D. He saw a kidney specialists at [facility] in December 2022 for his elevated creatinine. His [place] was c/w chronic medical renal disease. He was told to drink more fluids. He will be followed there.   3. The [facility]'s last [facility] visit was on 04/17/21. At that visit I continued his Synthroid dose of 100 mcg/day for 6 days per week, but skipping Sundays.    A. In the interim, he has been healthy.  B. He has had both covid vaccinations and both boosters.  C. He still occasionally has some scalp dryness and itchiness.  D. He is still being treated for ADHD with Adderall XR.  E. He has been more active in the past several months. He will play pickle ball, do equestrian events, and track in the spring with [employer].   F. He had a recent follow up exam at [facility]. Everything was good.   4. Pertinent Review of Systems:  Constitutional: [name] feels "fine". Mother says that he has been doing well overall. He remains very bright and sharp mentally. He is very social. Eyes: Vision is good overall. He only needs his glasses for reading. There are no other significant eye complaints. His last eye exam occurred in about August-September 2022.  He will have a follow up exam soon.  Neck: [name]  has no complaints of anterior neck swelling, soreness, tenderness,  pressure, discomfort, or difficulty swallowing.  Heart: Heart rate increases with exercise or other physical activity. Tony Copeland has no complaints of chest pain, or chest pressure.  Gastrointestinal: As above. Appetite is good. He is sensitive to dairy products. He does not have any constipation as long as he takes docusate three times per week. He does not have any other complaints of excessive hunger, acid reflux, or upset stomach. He is  no longer taking probiotic gummis.  Hands: No problems Legs: Muscle mass and strength seem normal. There are no complaints of numbness, tingling, burning, or pain. No edema is noted. Feet: There are no obvious foot problems. There are no complaints of numbness, tingling, burning, or pain. No edema is noted. Neurological: His coordination and strength remain good.   PAST MEDICAL, FAMILY, AND SOCIAL HISTORY:  Past Medical History:  Diagnosis Date   ADHD (attention deficit hyperactivity disorder)    Constipation - functional 09/29/2011   Miralax prn   Delayed linear growth    Down syndrome    Down's syndrome    Goiter    Hypothyroidism, acquired, autoimmune    Intussusception of small intestine ([place]) 03/1999   meckel's leading point   Isosexual precocity    Thyroid disease    Phreesia 06/20/2019   Thyroiditis, autoimmune     Family History  Problem Relation Age of Onset   Thyroid disease Mother    Thyroid disease Maternal Grandmother    Hypertension Maternal Grandmother    Cancer Maternal Grandfather        bone   Heart disease Maternal Grandfather    Cancer Paternal Grandmother        breast   Diabetes Neg Hx      Current Outpatient Medications:    amphetamine-dextroamphetamine (ADDERALL XR) 20 MG 24 hr capsule, Take 1 capsule (20 mg total) by mouth daily with breakfast., Disp: 31 capsule, Rfl: 0   SYNTHROID 100 MCG tablet, Take 1 tablet (100 mcg total) by mouth daily. Take 1 tablet by mouth 6 days a week, Disp: 30 tablet, Rfl: 21   amphetamine-dextroamphetamine (ADDERALL XR) 20 MG 24 hr capsule, Take 1 capsule (20 mg total) by mouth daily with breakfast., Disp: 31 capsule, Rfl: 0   [START ON 09/16/2021] amphetamine-dextroamphetamine (ADDERALL XR) 20 MG 24 hr capsule, Take 1 capsule (20 mg total) by mouth daily with breakfast. (Patient not taking: Reported on 08/19/2021), Disp: 31 capsule, Rfl: 0   ketoconazole (NIZORAL) 2 % shampoo, Apply 1 application topically 2 (two) times  a week. (Patient not taking: Reported on 06/25/2020), Disp: 120 mL, Rfl: 3   triamcinolone cream (KENALOG) 0.1 %, Apply 1 application topically 2 (two) times daily. For 2-3 weeks (Patient not taking: Reported on 10/17/2020), Disp: 30 g, Rfl: 0  Allergies as of 08/19/2021   (No Known Allergies)   1. School: He graduated from the 12th grade at [employer] in June 2022.  2. Activities:  As above. He has two part-time jobs:     3. Primary Care Provider: Marcha Solders, MD Tony Copeland is in the process of aging out of that practice. I again recommended Dr. Shanon Ace.  4. Tony Copeland is now covered by Medicaid.   REVIEW OF SYSTEMS: There are no other significant problems involving [name]'s other body systems.   Objective:   BP 118/68   Pulse 74   Wt 135 lb 3.2 oz (61.3 kg)   BMI 24.10 kg/m   Wt Readings  from Last 3 Encounters:  08/19/21 135 lb 3.2 oz (61.3 kg)  07/01/21 132 lb 1.6 oz (59.9 kg)  04/17/21 138 lb (62.6 kg)    Ht Readings from Last 3 Encounters:  07/01/21 5' 2.8" (1.595 m)  04/17/21 5' 4.1" (1.628 m)  11/21/20 5' 2.75" (1.594 m)   Body mass index is 24.1 kg/m. Facility age limit for growth %iles is 20 years.  Body surface area is 1.65 meters squared.  Constitutional: [name] looks healthy and appears physically and emotionally well. His weight has increased 3 pounds. He is alert and right. He engaged will with his mother and with me. He is very bright and sharp for a young man with Down's syndrome. His affect is normal today. His insight is fairly good. He brought in one of his [employer] gold medals on a red ribbon that he wanted to give me for taking such good care of him over the years.  Face: He has a typical Downs facies, with some asymmetrical rotation of the face and jaw. He now has a grade 3 mustache.  Eyes: The eyes are typical for Down's syndrome. There is no arcus or proptosis.  Mouth: The oropharynx appears normal. The tongue appears normal. There is no obvious  gingivitis. He has a narrow jaw with some dental crowding. There is normal oral moisture.  Neck: There are no bruits present. The thyroid gland appears normal in size. The thyroid gland is again enlarged about 21 grams in size. The lobes are symmetrically enlarged today. The consistency of the thyroid gland is normal. There is no thyroid tenderness to palpation. Lungs: The lungs are clear. Air movement is good. Heart: The heart rhythm and rate appear normal. Heart sounds S1 and S2 are normal. I do not appreciate any pathologic heart murmurs. Abdomen: The abdominal size is normal. Bowel sounds are normal. The abdomen is soft and non-tender. There is no obviously palpable hepatomegaly, splenomegaly, or other masses.  Arms: Muscle mass appears appropriate for age.  Hands: There is no obvious tremor. Phalangeal and metacarpophalangeal joints appear normal. Palms are normal. Nail beds are a bit pallid. Legs: Muscle mass appears appropriate for age. There is no edema.  Neurologic: Muscle strength is normal 5+ in both the upper and the lower extremities. Muscle tone appears normal. Sensation to touch is normal in the legs.  LAB DATA:   Labs 07/08/21: TSH 0.43, free T4 1.3, free T3 3.2  Labs 04/03/21: TSH 0.43, free T4 1.3, free T3 3.3  Labs 10/17/20: TSH 1.13, free T4 1.3, free T3 3.6; CMP normal, except creatinine 1.5 (ref 0.60-1.24), CBC normal;  iron 63, tTG IgA <1.0, IGA 338 (ref 47-310)  Labs 06/01/20: TSH 0.22, free T4 1.5, free T3 3.6  Labs 07/29/19: CMP normal, except creatinine 1.39; CBC normal, except Hct 51.4 (38.5-50.0); cholesterol 161, triglycerides 88, HDL 40, LDL 103  Labs 07/20/19: TSH 1.25, free T4 1.5, free T3 3.3;   Labs 07/15/19: non-fasting labs: HbA1c 4.8%; TSH 1.25, free T4 1.5, free T3 3.3; CMP normal, except creatinine 1.43 (ref 0.60-1.35); CBC normal, except Hct 50.2 (ref 38-50) and MCH 33.1 (ref 27-33); cholesterol 165, triglycerides 116, HDL 37, LDL 107; 25-OH vitamin D  38  01/05/19: TSH 0.86, free T4 1.3, free T3 3.4  07/14/18: TSH 1.07, free T4 1.3, free T3 3.1  03/24/18: TSH 1.42, free T4 1.0, free T3 3.1  09/24/17: TSH 1.82, free T4 1.3, free T3 3.2  08/13/16: TSH 2.68, free T4 1.38, free T3  2.7  10/19/15: TSH 3.04, free T4 1.3, free T3 3.04; normal CBC  06/13/15: HbA1c 5.0%; TSH 2.34, free T4 1.3, free T3 3.2  11/24/14: HbA1c 5.0%; TSH 1.899, free T4 1.20, free T3 3.1; C-peptide 3.11 (ref 0.80-3.90)  05/24/14: TSH 2.338, free T4 1.05, free T3 3.3; C-peptide 2.92 (ref 0.80-3.90)  11/14/13: TSH 1.946, free T4 1.21, free T3 3.5   05/18/13: TSH 1.541, free T4 1.27, free T3 3.2  11/09/12: TSH 1.454, free T4 1.39, free T3 3.4  04/30/12: TSH 1.407, free T4 1.28, free T3 3.5  11/03/11: TSH 1.366, free T4 1.41, free T3 3.6   Assessment and Plan:   ASSESSMENT:  1. Hypothyroid, secondary to Hashimoto's disease:   A. We have adjusted Tony Copeland's Synthroid dosage over time to try to achieve a TSH in the goal range of 1.0-2.0.   B. His TFTs in July 2021 were quite good. However, his TFTs in May 2022 were a bit elevated, so I reduced his Synthroid dose by 16%. His TFTs in October 2022 were mid-euthyroid.  C. In March 2023, his TFTs did not show the usual physiologic thermostat-furnace relationship. His TSH and his free T3 were lower, his free T4 was unchanged. This type of shift in TFTs is pathognomonic for a recent flare up of thyroiditis. In July 2023 the thyroid tests were essentially unchanged. We will decease his dose a bit today  D. He is clinically euthyroid on his current Synthroid dose today.  E. We will repeat his TFTs in late September.    2-3. Goiter/thyroiditis:   A. His thyroid gland is unchanged in size since his last visit.    B. He has not been complaining of any symptoms. His thyroiditis is clinically quiescent today. 4. Down's syndrome: [name] is blessed to have a good family that provides him emotional support and ensures that he is as active  athletically and socially as he can be.  He really enjoyed resuming S.O. activities and is looking forward to participating again in the coming year. Family works hard to keep him active and involved.   5. Hypercholesterolemia: He has a lipid panel done on 07/15/19, but he was not fasting at the time. His repeat study was essentially unchanged.  6. Overweight: [name] has re-gained 3 pounds since his last visit.  7-9. Elevated creatinine/ hematocrit, and MCH: Tony Copeland was seen by one of the nephrologists at [facility] in December 2022. Marland Kitchen His US showed mild, increased renal parenchymal echogenicity c/w chronic medical renal disease. The nephrologist again asked Tony Copeland to increase his drinking of fluids.  10: Stomach pains and diarrhea: Resolved. His screen for celiac disease was normal.  11. Nailbed pallor: His CBC and iron level were normal in October 2022. The pallor is likely due to his vasculature. deficiency.  PLAN:  1. Diagnostic: We reviewed his lab results from May 2022, March 2023, and July 2023. Repeat TFTs in late September. .      2. Therapeutic: Change the Synthroid dose to 100 mcg/day for 5 days per week, on one day take only 50 mcg (1/2 tablet), and on one day do not take any Synthroid.   3. Patient education: We discussed the likelihood that his Hashimoto's thyroiditis will eventually destroy more and more thyrocytes, resulting in larger doses of Synthroid over time. Also discussed the importance of healthy diet and increasing his  level of physical activity. I recommended referral to Dr. Dagmar Hait for endocrinology and to Dr. Zacarias Pontes for primary care. 4.  Follow-up: 6 months  Level of Service: This visit lasted 55 minutes.   Tillman Sers, MD, CDE [facility]

## 2021-08-19 ENCOUNTER — Encounter (INDEPENDENT_AMBULATORY_CARE_PROVIDER_SITE_OTHER): Payer: Self-pay | Admitting: "Endocrinology

## 2021-08-19 ENCOUNTER — Encounter (INDEPENDENT_AMBULATORY_CARE_PROVIDER_SITE_OTHER): Payer: Self-pay

## 2021-08-19 ENCOUNTER — Ambulatory Visit (INDEPENDENT_AMBULATORY_CARE_PROVIDER_SITE_OTHER): Payer: Medicaid Other | Admitting: "Endocrinology

## 2021-08-19 VITALS — BP 118/68 | HR 74 | Wt 135.2 lb

## 2021-08-19 DIAGNOSIS — R7989 Other specified abnormal findings of blood chemistry: Secondary | ICD-10-CM

## 2021-08-19 DIAGNOSIS — E049 Nontoxic goiter, unspecified: Secondary | ICD-10-CM | POA: Diagnosis not present

## 2021-08-19 DIAGNOSIS — Q909 Down syndrome, unspecified: Secondary | ICD-10-CM

## 2021-08-19 DIAGNOSIS — E063 Autoimmune thyroiditis: Secondary | ICD-10-CM | POA: Diagnosis not present

## 2021-08-19 NOTE — Patient Instructions (Addendum)
No further follow up here.  Please change the Synthroid dosage to one 100 mcg tablet per day for 5 days each week. On one day, take 50 mcg = 1/2 tablet. On one day each week do not take any Synthroid.  I recommend referral to Dr. Debara Pickett, [facility] and to Dr. Berniece Andreas for adult primary care.   At [facility], we are committed to providing exceptional care. You will receive a patient satisfaction survey through text or email regarding your visit today. Your opinion is important to me. Comments are appreciated

## 2021-09-16 ENCOUNTER — Ambulatory Visit (INDEPENDENT_AMBULATORY_CARE_PROVIDER_SITE_OTHER): Payer: Medicaid Other | Admitting: Pediatrics

## 2021-09-16 VITALS — BP 124/72 | Ht 62.8 in | Wt 138.1 lb

## 2021-09-16 DIAGNOSIS — F902 Attention-deficit hyperactivity disorder, combined type: Secondary | ICD-10-CM

## 2021-09-16 DIAGNOSIS — L739 Follicular disorder, unspecified: Secondary | ICD-10-CM | POA: Diagnosis not present

## 2021-09-16 DIAGNOSIS — Z760 Encounter for issue of repeat prescription: Secondary | ICD-10-CM | POA: Diagnosis not present

## 2021-09-16 MED ORDER — CLINDAMYCIN HCL 300 MG PO CAPS: 300.0000 mg | ORAL_CAPSULE | Freq: Two times a day (BID) | ORAL | 0 refills | Status: AC

## 2021-09-16 NOTE — Progress Notes (Unsigned)
Impet[place]   21  year old male with trisomy 21 who presents for evaluation of a possible skin infection located at right mid neck associated with an ingrown hair follicle. Symptoms include erythema located to right neck. Patient denies chills and fever greater than 100. Precipitating event: hair follicle  Treatment to date has included warm pack with no relief.   The following portions of the patient's history were reviewed and updated as appropriate: allergies, current medications, past family history, past medical history, past social history, past surgical history and problem list.   Review of Systems  Pertinent items are noted in HPI.   Objective:    General appearance: alert and cooperative  Ears: normal TM's and external ear canals both ears  Nose: Nares normal. Septum midline. Mucosa normal. No drainage or sinus tenderness.  Lungs: clear to auscultation bilaterally  Heart: regular rate and rhythm, S1, S2 normal, no murmur, click, rub or gallop  Extremities: normal except for right thumb with tenderness, erythema and swelling to medial aspect of thumb.  Skin: Skin color, texture, turgor normal. Swollen erythematous 1 cm lesion to right neck --non fluctuant and no discharge, Neurologic: Grossly normal   Assessment:    Infected hair follicle to right neck   Plan:    Clindamycin and bactroban prescribed.  Pain medication: OTC.  Wound cleansed and I and D to be done if not improved in 48 hours Follow up in 2 days  ADHD meds refilled

## 2021-09-16 NOTE — Patient Instructions (Signed)
MRSA Infection, Diagnosis, Adult Methicillin-resistant Staphylococcus aureus (MRSA) infection is caused by bacteria called Staphylococcus aureus, or staph, that no longer respond to common antibiotic medicines (drug-resistant bacteria). MRSA infection can be hard to treat. Most of the time, MRSA can be on the skin or in the nose without causing problems (colonized). However, if MRSA enters the body through a cut, a sore, or an invasive medical device, it can cause a serious infection. What are the causes? This condition is caused by staph bacteria. Illness may develop after exposure to the bacteria through: Skin-to-skin contact with someone who is infected with MRSA. Touching surfaces that have the bacteria on them. Having a procedure or using equipment that allows MRSA to enter the body. Having MRSA that lives on your skin and then enters your body through: A cut or scratch. A surgery or procedure. The use of a medical device. Contact with the bacteria may occur: During a stay in a hospital, rehabilitation facility, nursing home, or other health care facility (health care-associated MRSA). In daily activities where there is close contact with others, such as sports, child care centers, or at home (community-associated MRSA). What increases the risk? You are more likely to develop this condition if you: Have a surgery or procedure. Have an IV or a thin tube (catheter) placed in your body. Are elderly. Are on kidney dialysis. Have recently taken an antibiotic medicine. Live in a long-term care facility. Have a chronic wound or skin ulcer. Have a weak body defense system (immune system). Play sports that involve skin-to-skin contact. Live in a crowded place, like a dormitory or military barracks. Share towels, razors, or sports equipment with other people. Have a history of MRSA infection or colonization. What are the signs or symptoms? Symptoms of this condition depend on the area that  is affected. Symptoms may include: A pus-filled pimple or boil. Pus that drains from your skin. A sore (abscess) under your skin or somewhere in your body. Fever with or without chills. Difficulty breathing. Coughing up blood. Redness, warmth, swelling, or pain in the affected area. How is this diagnosed? This condition may be diagnosed based on: A physical exam. Your medical history. Taking a sample from the infected area and growing it in a lab (culture). You may also have other tests, including: Imaging tests, such as X-rays, a CT scan, or an MRI. Lab tests, such as blood, urine, or phlegm (sputum) tests. You skin or nose may be swabbed when you are admitted to a health care facility for a procedure. This is to screen for MRSA. How is this treated? Treatment depends on the type of MRSA infection you have and how severe, deep, or extensive it is. Treatment may include: Antibiotic medicines. Surgery to drain pus from the infected area. Severe infections may require a hospital stay. Follow these instructions at home: Medicines Take over-the-counter and prescription medicines only as told by your health care provider. If you were prescribed an antibiotic medicine, use it as told by your health care provider. Do not stop using the antibiotic even if you start to feel better. Prevention Follow these instructions to avoid spreading the infection to others: Wash your hands frequently with soap and water. If soap and water are not available, use an alcohol-based hand sanitizer. Avoid close contact with those around you as much as possible. Do not use towels, razors, toothbrushes, bedding, or other items that will be used by others. Wash towels, bedding, and clothes in the washing machine with detergent   and hot water. Dry them in a hot dryer. Clean surfaces regularly to remove germs (disinfection). Use products or solutions that contain bleach. Make sure you disinfect bathroom surfaces, food  preparation areas, exercise equipment, and doorknobs.  General instructions If you have a wound, follow instructions from your health care provider about how to take care of your wound. Do not pick at scabs. Do not try to drain any infection sites or pimples. Tell all your health care providers that you have MRSA, or if you have ever had a MRSA infection. Keep all follow-up visits as told by your health care provider. This is important. Contact a health care provider if you: Do not get better. Have symptoms that get worse. Have new symptoms. Get help right away if you have: Nausea or vomiting, or if you cannot take medicine without vomiting. Trouble breathing. Chest pain. These symptoms may represent a serious problem that is an emergency. Do not wait to see if the symptoms will go away. Get medical help right away. Call your local emergency services (911 in the [place] Do not drive yourself to the hospital. Summary MRSA infection is caused by bacteria called Staphylococcus aureus, or staph, that no longer respond to common antibiotic medicines. Treatment for this condition depends on the type of MRSA infection you have and how severe, deep, and extensive it is. If you were prescribed an antibiotic medicine, use it as told by your health care provider. Do not stop using the antibiotic even if you start to feel better. Follow instructions from your health care provider to avoid spreading the infection to others. This information is not intended to replace advice given to you by your health care provider. Make sure you discuss any questions you have with your health care provider. Document Revised: 03/11/2018 Document Reviewed: 03/12/2018 Elsevier Patient Education  2023 [employer] Inc[employer]  

## 2021-09-17 ENCOUNTER — Encounter: Payer: Self-pay | Admitting: Pediatrics

## 2021-09-17 DIAGNOSIS — Z760 Encounter for issue of repeat prescription: Secondary | ICD-10-CM | POA: Insufficient documentation

## 2021-09-17 DIAGNOSIS — L739 Follicular disorder, unspecified: Secondary | ICD-10-CM | POA: Insufficient documentation

## 2021-09-26 LAB — T4, FREE: Free T4: 1.4 ng/dL (ref 0.8–1.8)

## 2021-09-26 LAB — TSH: TSH: 0.51 mIU/L (ref 0.40–4.50)

## 2021-09-26 LAB — T3, FREE: T3, Free: 3.7 pg/mL (ref 2.3–4.2)

## 2021-09-30 ENCOUNTER — Ambulatory Visit (INDEPENDENT_AMBULATORY_CARE_PROVIDER_SITE_OTHER): Payer: Medicaid Other | Admitting: "Endocrinology

## 2021-10-11 ENCOUNTER — Telehealth (INDEPENDENT_AMBULATORY_CARE_PROVIDER_SITE_OTHER): Payer: Self-pay

## 2021-10-11 ENCOUNTER — Ambulatory Visit (INDEPENDENT_AMBULATORY_CARE_PROVIDER_SITE_OTHER): Payer: Medicaid Other | Admitting: Pediatrics

## 2021-10-11 VITALS — Wt 133.3 lb

## 2021-10-11 DIAGNOSIS — L03032 Cellulitis of left toe: Secondary | ICD-10-CM

## 2021-10-11 MED ORDER — LEVOTHYROXINE SODIUM 75 MCG PO TABS: 75.0000 ug | ORAL_TABLET | Freq: Every day | ORAL | 3 refills | Status: AC

## 2021-10-11 MED ORDER — CEPHALEXIN 500 MG PO CAPS: 500.0000 mg | ORAL_CAPSULE | Freq: Three times a day (TID) | ORAL | 0 refills | Status: AC

## 2021-10-11 NOTE — Patient Instructions (Signed)

## 2021-10-11 NOTE — Telephone Encounter (Signed)
Spoke with mom. Gave results and medication change. Informed her to ask Dr Juanell Fairly if he could refer her to a new endocrinologist or if his PCP can handle it.

## 2021-10-11 NOTE — Telephone Encounter (Signed)
-----   Message from Sherrlyn Hock, MD sent at 10/07/2021 11:32 PM EDT ----- Thyroid tests are better, but borderline high. He needs a bit less thyroid hormone in a more manageable dosage regimen.  Clinical staff: Please order brand Synthroid, 75 mcg/day. Thanks.  Dr.Brennan

## 2021-10-11 NOTE — Progress Notes (Signed)
Subjective:    Tony Copeland is a 21 y.o. old male here with his mother for Toe Pain (Left toe pain)   HPI: Tony Copeland presents with history of workout few days ago but does not remember hitting foot.  Had some pain on top of large right toe on Wednesday afternoon.  Complaint was painful yesterday walking on it.  Started to limp some yesterday.  Today looks more red today and swollen around the toe.      The following portions of the patient's history were reviewed and updated as appropriate: allergies, current medications, past family history, past medical history, past social history, past surgical history and problem list.  Review of Systems Pertinent items are noted in HPI.   Allergies: Not on File   Current Outpatient Medications on File Prior to Visit  Medication Sig Dispense Refill   amphetamine-dextroamphetamine (ADDERALL XR) 20 MG 24 hr capsule Take 1 capsule (20 mg total) by mouth daily with breakfast. 31 capsule 0   amphetamine-dextroamphetamine (ADDERALL XR) 20 MG 24 hr capsule Take 1 capsule (20 mg total) by mouth daily with breakfast. 31 capsule 0   amphetamine-dextroamphetamine (ADDERALL XR) 20 MG 24 hr capsule Take 1 capsule (20 mg total) by mouth daily with breakfast. (Patient not taking: Reported on 08/19/2021) 31 capsule 0   ketoconazole (NIZORAL) 2 % shampoo Apply 1 application topically 2 (two) times a week. (Patient not taking: Reported on 06/25/2020) 120 mL 3   SYNTHROID 100 MCG tablet Take 1 tablet (100 mcg total) by mouth daily. Take 1 tablet by mouth 6 days a week 30 tablet 21   triamcinolone cream (KENALOG) 0.1 % Apply 1 application topically 2 (two) times daily. For 2-3 weeks (Patient not taking: Reported on 10/17/2020) 30 g 0   No current facility-administered medications on file prior to visit.    History and Problem List: Past Medical History:  Diagnosis Date   ADHD (attention deficit hyperactivity disorder)    Constipation - functional 09/29/2011   Miralax prn    Delayed linear growth    Down syndrome    Down's syndrome    Goiter    Hypothyroidism, acquired, autoimmune    Intussusception of small intestine ([place]) 03/1999   meckel's leading point   Isosexual precocity    Thyroid disease    [employer] 06/20/2019   Thyroiditis, autoimmune         Objective:    Wt 133 lb 4.8 oz (60.5 kg)   BMI 23.76 kg/m   General: alert, active, non toxic, age appropriate interaction Lungs: clear to auscultation, no wheeze, crackles or retractions, unlabored breathing Heart: RRR, Nl S1, S2, no murmurs Abd: soft, non tender, non distended, normal BS, no organomegaly, no masses appreciated Skin: left lateral great toe on border of nail with erythema/mild swelling and painful to touch, no drainage Neuro: normal mental status, No focal deficits  No results found for this or any previous visit (from the past 72 hour(s)).     Assessment:   Tony Copeland is a 21 y.o. old male with  1. Cellulitis of great toe, left     Plan:   --mild cellulitis of left great toe, no appearance of abscess formation.  Supportive care discussed for pain.   Meds ordered this encounter  Medications   cephALEXin (KEFLEX) 500 MG capsule    Sig: Take 1 capsule (500 mg total) by mouth 3 (three) times daily for 7 days.    Dispense:  21 capsule    Refill:  0  Return if symptoms worsen or fail to improve.+ in 2-3 days or prior for concerns  Myles Gip, DO

## 2021-10-17 ENCOUNTER — Encounter: Payer: Self-pay | Admitting: Pediatrics

## 2021-10-23 ENCOUNTER — Ambulatory Visit (INDEPENDENT_AMBULATORY_CARE_PROVIDER_SITE_OTHER): Payer: Medicaid Other | Admitting: "Endocrinology

## 2021-11-11 ENCOUNTER — Telehealth: Payer: Self-pay | Admitting: Pediatrics

## 2021-11-11 MED ORDER — AMPHETAMINE-DEXTROAMPHET ER 20 MG PO CP24: 20.0000 mg | ORAL_CAPSULE | Freq: Every day | ORAL | 0 refills | Status: AC

## 2021-11-11 NOTE — Telephone Encounter (Signed)
Refilled ADHD medications  

## 2021-11-11 NOTE — Telephone Encounter (Signed)
Mother called and stated that Tony Copeland needs a refill on Adderall.  [facility] [place]

## 2023-07-21 IMAGING — US US RENAL
1 series · 14 of 25 positions shown · non-contrast
Comparison: None.

CLINICAL DATA: Stage III A chronic kidney disease.

EXAM:
RENAL / URINARY TRACT ULTRASOUND COMPLETE

[Series 1: us renal · 0.23mm/px · 14 of 40 slices shown]
[im 1/40]
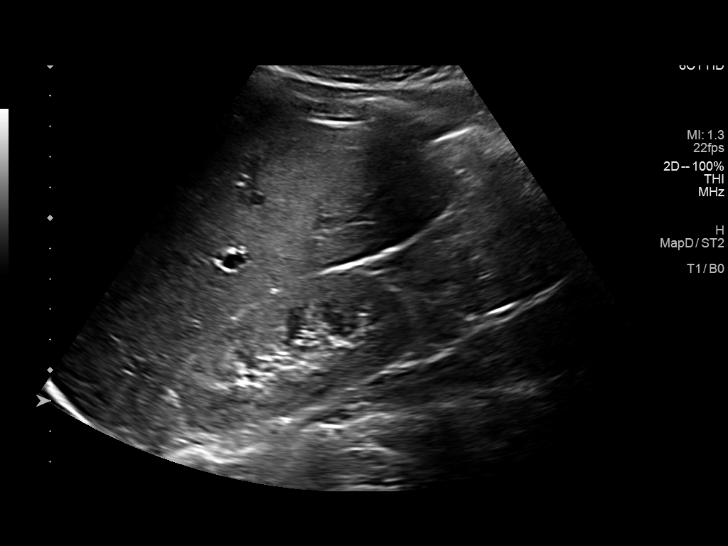
[im 4/40]
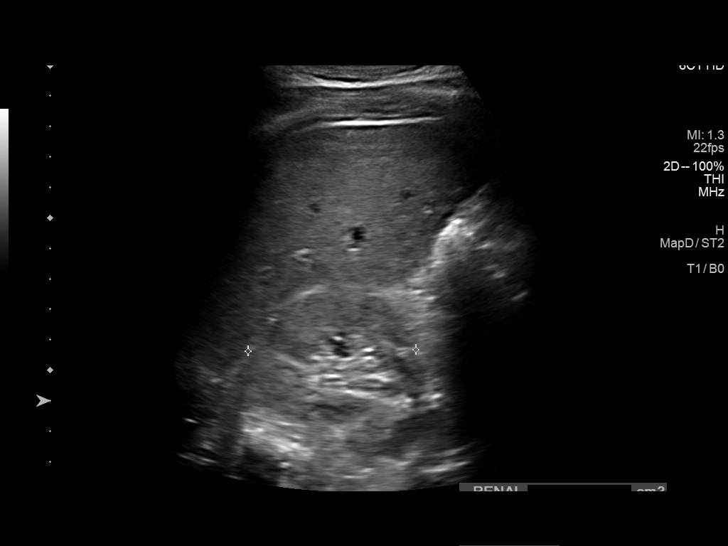
[im 7/40]
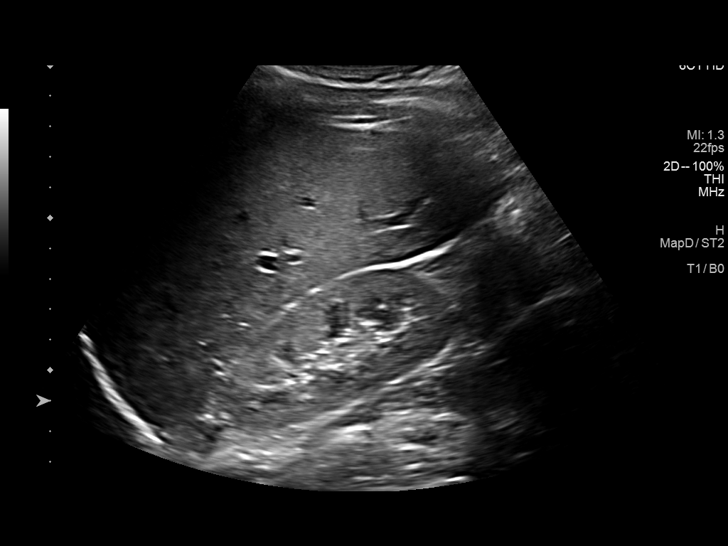
[im 10/40]
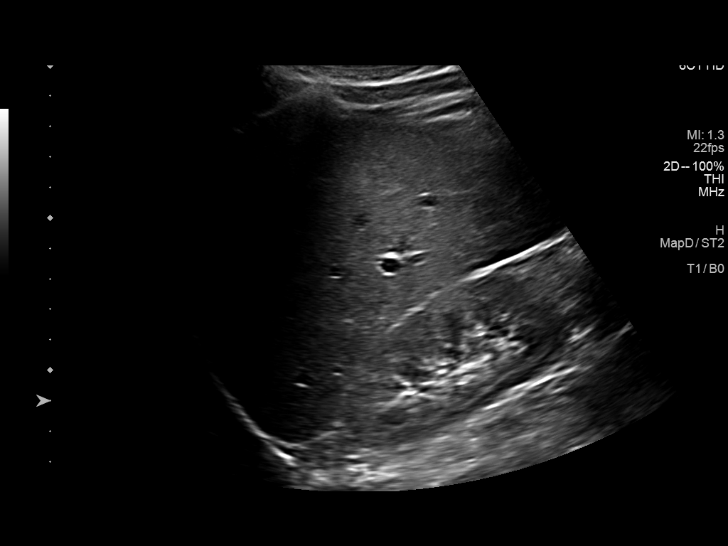
[im 14/40]
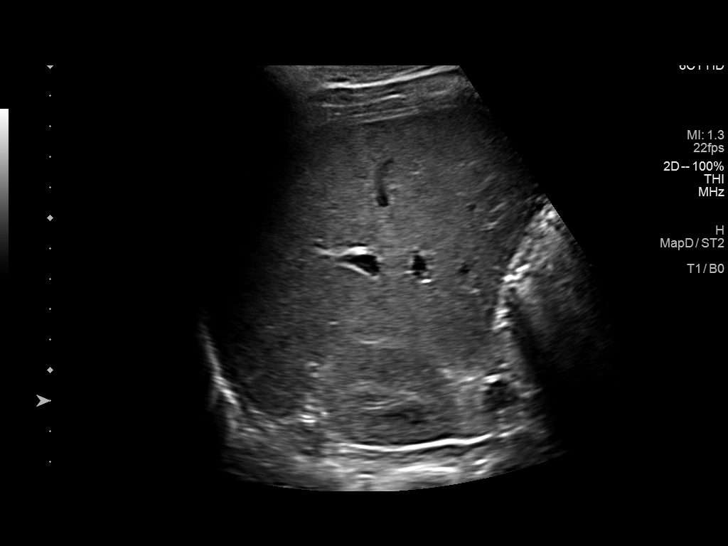
[im 15/40]
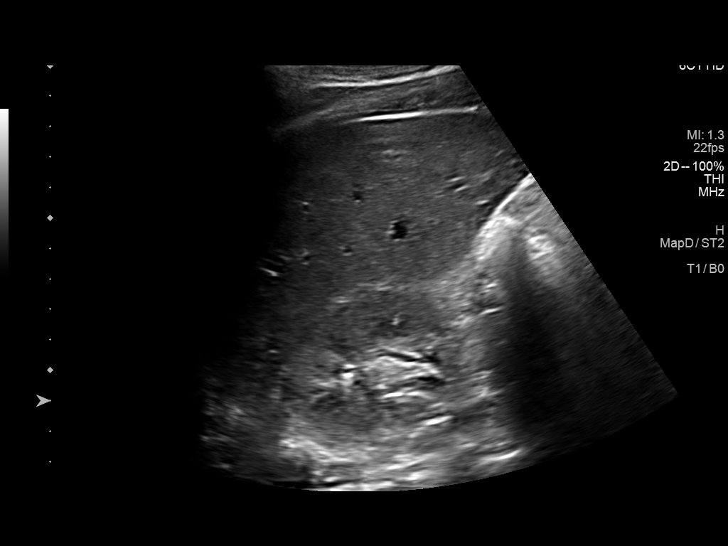
[im 18/40]
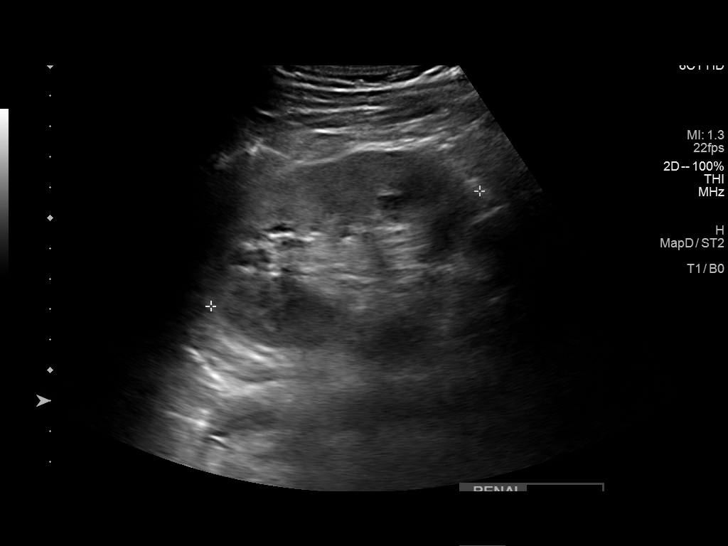
[im 22/40]
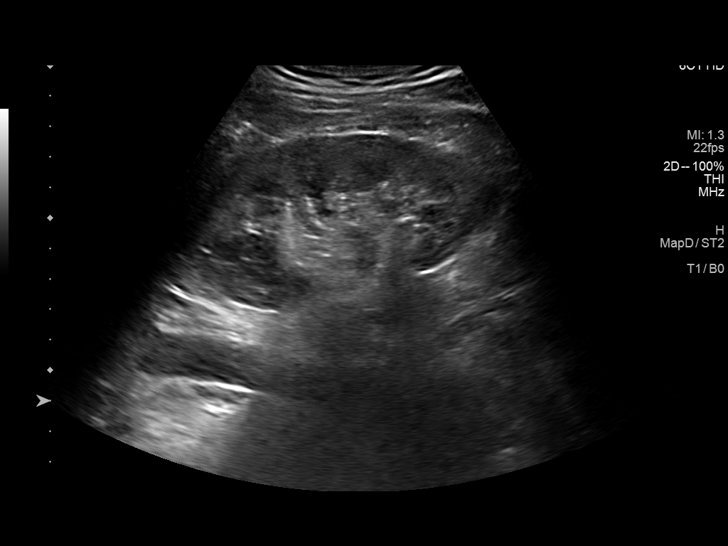
[im 25/40]
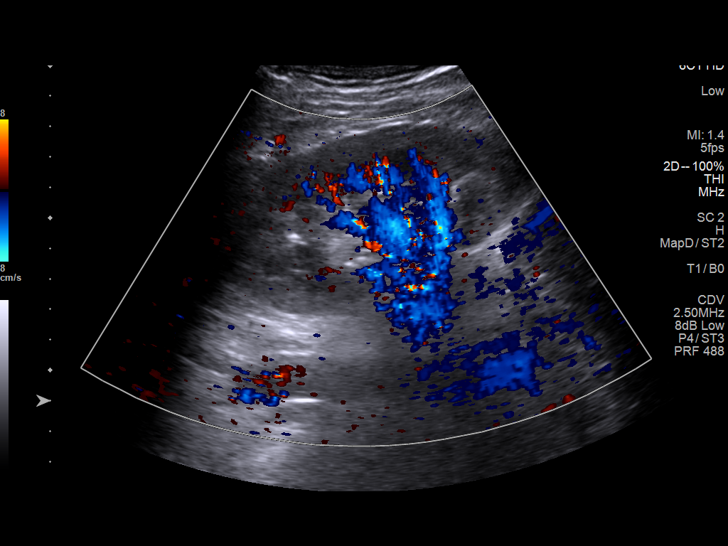
[im 27/40]
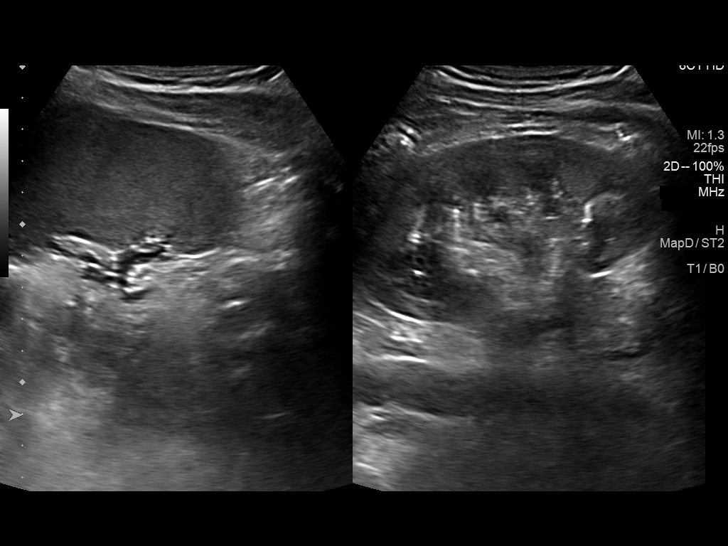
[im 30/40]
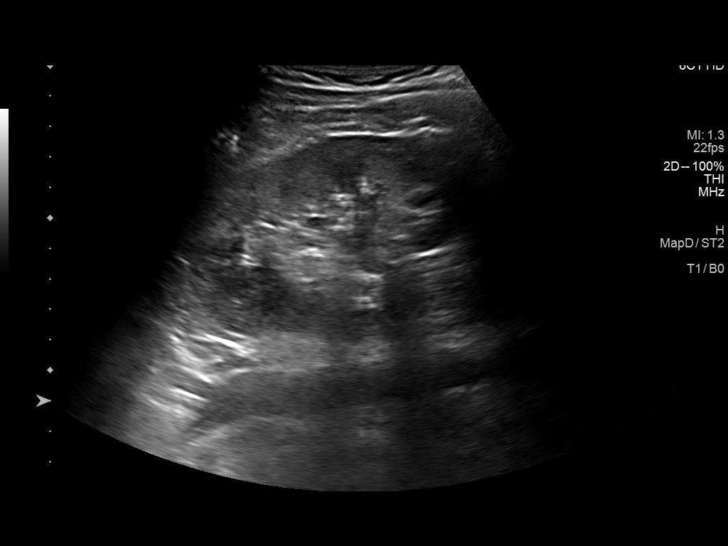
[im 33/40]
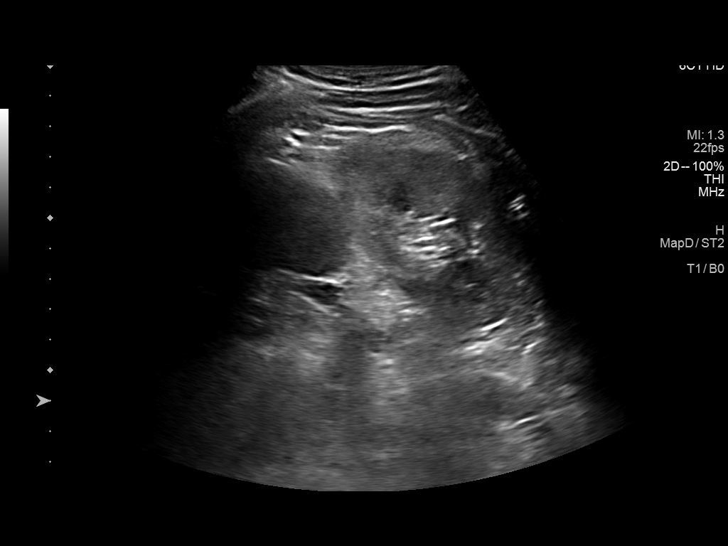
[im 36/40]
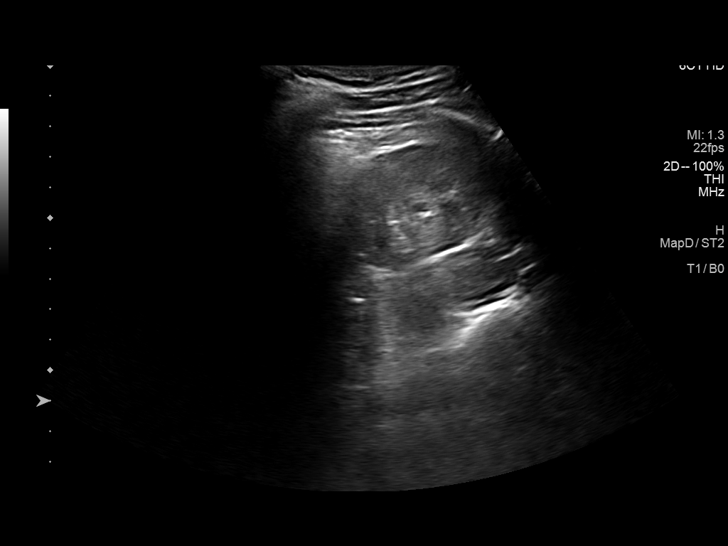
[im 40/40]
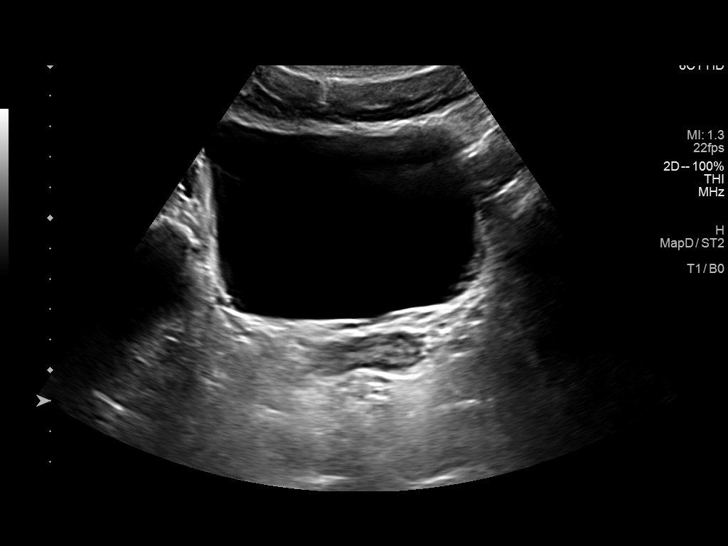

[14 of 25 positions shown; findings below may reference images not displayed]

FINDINGS: Right Kidney:

Renal measurements: 8.3 x 3.8 x 5.5 cm = volume: 92 mL. Mild
increased renal parenchymal echogenicity. No hydronephrosis. No
visualized focal lesion or stone.

Left Kidney:

Renal measurements: 9.6 x 6.4 x 6.6 cm = volume: 210 mL. Mild
increased renal parenchymal echogenicity. No hydronephrosis. No
visualized focal lesion or stone.

Bladder:

Appears normal for degree of bladder distention.

Other:

None.
IMPRESSION: Mild increased bilateral renal parenchymal echogenicity consistent
chronic medical renal disease. No obstructive uropathy or focal
renal abnormality.

## 2023-10-28 ENCOUNTER — Encounter: Payer: Self-pay | Admitting: Physician Assistant

## 2023-10-28 ENCOUNTER — Other Ambulatory Visit: Payer: Self-pay | Admitting: Physician Assistant

## 2023-10-28 ENCOUNTER — Ambulatory Visit: Payer: MEDICAID | Admitting: Physician Assistant

## 2023-10-28 VITALS — BP 139/92 | HR 73

## 2023-10-28 DIAGNOSIS — L700 Acne vulgaris: Secondary | ICD-10-CM | POA: Diagnosis not present

## 2023-10-28 DIAGNOSIS — L739 Follicular disorder, unspecified: Secondary | ICD-10-CM | POA: Diagnosis not present

## 2023-10-28 DIAGNOSIS — L709 Acne, unspecified: Secondary | ICD-10-CM

## 2023-10-28 MED ORDER — TRETINOIN 0.1 % EX CREA: TOPICAL_CREAM | Freq: Every day | CUTANEOUS | 0 refills | Status: DC

## 2023-10-28 NOTE — Patient Instructions (Addendum)

## 2023-10-28 NOTE — Progress Notes (Signed)
   New Patient Visit   Subjective  Tony Copeland is a 23 y.o. male NEW PATIENT who presents for the following: acne  Patient states he  has acne located at the face and back for many years. Is currently taking minocycline now for 6 months. His sister did accutane.   Other concern: red bumps on wastline and sometimes on legs.   ACCOMPANIED BY MOTHER  The following portions of the chart were reviewed this encounter and updated as appropriate: medications, allergies, medical history  Review of Systems:  No other skin or systemic complaints except as noted in HPI or Assessment and Plan.  Objective  Well appearing patient in no apparent distress; mood and affect are within normal limits.  A focused examination was performed of the following areas: [facility] AND ARMS  Relevant exam findings are noted in the Assessment and Plan.              Assessment & Plan   ACNE VULGARIS Exam: Open comedones and inflammatory papules worse on back, few on face.   Flared on back   Treatment Plan: - start tretinoin 0.1% nightly on back and every other night on face  - f/u in 6 months  - briefly discussed isotretinoin      FOLLICULITIS- WAIST BAND/thighs  Exam: resolved with post inflammatory hyperpigmentation   Folliculitis occurs due to inflammation of the superficial hair follicle (pore), resulting in acne-like lesions (pus bumps). It can be infectious (bacterial, fungal) or noninfectious (shaving, tight clothing, heat/sweat, medications).  Folliculitis can be acute or chronic and recommended treatment depends on the underlying cause of folliculitis.  Treatment Plan: -  wash daily with Dial and BP wash   ACNE, UNSPECIFIED ACNE TYPE   Related Medications tretinoin (RETIN-A) 0.1 % cream Apply topically at bedtime. FOLLICULITIS    Return in about 6 months (around 04/27/2024) for Acne/HS FOLLOW UP.  I, Doyce Pan, CMA, am acting as scribe for [employer], PA-C.    Documentation: I have reviewed the above documentation for accuracy and completeness, and I agree with the above.  Buryl Bamber K, PA-C

## 2023-10-29 ENCOUNTER — Other Ambulatory Visit: Payer: Self-pay | Admitting: Dermatology

## 2023-10-29 ENCOUNTER — Other Ambulatory Visit: Payer: Self-pay

## 2023-10-29 DIAGNOSIS — L739 Follicular disorder, unspecified: Secondary | ICD-10-CM

## 2023-10-29 DIAGNOSIS — L709 Acne, unspecified: Secondary | ICD-10-CM

## 2023-10-29 MED ORDER — RETIN-A 0.025 % EX CREA: TOPICAL_CREAM | Freq: Every day | CUTANEOUS | 0 refills | Status: DC

## 2023-11-03 ENCOUNTER — Other Ambulatory Visit: Payer: Self-pay

## 2023-11-03 DIAGNOSIS — L709 Acne, unspecified: Secondary | ICD-10-CM

## 2023-11-03 MED ORDER — ADAPALENE-BENZOYL PEROXIDE 0.1-2.5 % EX GEL: 1.0000 | Freq: Every day | CUTANEOUS | 4 refills | Status: AC

## 2024-04-27 ENCOUNTER — Ambulatory Visit: Payer: MEDICAID | Admitting: Physician Assistant
